# Patient Record
Sex: Male | Born: 1960 | Race: White | Hispanic: No | Marital: Married | Smoking: Never smoker
Health system: Southern US, Community
[De-identification: ages and names within clinical notes are randomized; demographics above are authoritative.]

## PROBLEM LIST (undated history)

## (undated) DIAGNOSIS — M109 Gout, unspecified: Secondary | ICD-10-CM

## (undated) DIAGNOSIS — I10 Essential (primary) hypertension: Secondary | ICD-10-CM

## (undated) DIAGNOSIS — Z86010 Personal history of colonic polyps: Secondary | ICD-10-CM

## (undated) HISTORY — DX: Personal history of colonic polyps: Z86.010

## (undated) HISTORY — DX: Essential (primary) hypertension: I10

## (undated) HISTORY — PX: WRIST SURGERY: SHX841

## (undated) HISTORY — DX: Gout, unspecified: M10.9

---

## 2005-10-20 ENCOUNTER — Ambulatory Visit: Payer: Self-pay | Admitting: Family Medicine

## 2005-10-29 ENCOUNTER — Ambulatory Visit: Payer: Self-pay | Admitting: Family Medicine

## 2006-07-31 ENCOUNTER — Ambulatory Visit: Payer: Self-pay | Admitting: Internal Medicine

## 2007-08-16 DIAGNOSIS — I10 Essential (primary) hypertension: Secondary | ICD-10-CM | POA: Insufficient documentation

## 2010-09-17 ENCOUNTER — Ambulatory Visit: Payer: Self-pay | Admitting: Family Medicine

## 2010-09-18 LAB — CONVERTED CEMR LAB
ALT: 29 units/L (ref 0–53)
AST: 30 units/L (ref 0–37)
Albumin: 4.3 g/dL (ref 3.5–5.2)
Alkaline Phosphatase: 56 units/L (ref 39–117)
BUN: 10 mg/dL (ref 6–23)
Basophils Absolute: 0.1 10*3/uL (ref 0.0–0.1)
Basophils Relative: 1.1 % (ref 0.0–3.0)
Bilirubin, Direct: 0.1 mg/dL (ref 0.0–0.3)
CO2: 22 meq/L (ref 19–32)
Calcium: 9.5 mg/dL (ref 8.4–10.5)
Chloride: 109 meq/L (ref 96–112)
Cholesterol: 152 mg/dL (ref 0–200)
Creatinine, Ser: 0.7 mg/dL (ref 0.4–1.5)
Eosinophils Absolute: 0.2 10*3/uL (ref 0.0–0.7)
Eosinophils Relative: 4 % (ref 0.0–5.0)
GFR calc non Af Amer: 127.49 mL/min (ref >60)
Glucose, Bld: 91 mg/dL (ref 70–99)
HCT: 40.8 % (ref 39.0–52.0)
HDL: 40.9 mg/dL (ref >39.00)
Hemoglobin: 14.3 g/dL (ref 13.0–17.0)
LDL Cholesterol: 99 mg/dL (ref 0–99)
Lymphocytes Relative: 34.1 % (ref 12.0–46.0)
Lymphs Abs: 1.7 10*3/uL (ref 0.7–4.0)
MCHC: 35.1 g/dL (ref 30.0–36.0)
MCV: 92.5 fL (ref 78.0–100.0)
Monocytes Absolute: 0.4 10*3/uL (ref 0.1–1.0)
Monocytes Relative: 7.8 % (ref 3.0–12.0)
Neutro Abs: 2.6 10*3/uL (ref 1.4–7.7)
Neutrophils Relative %: 53 % (ref 43.0–77.0)
Platelets: 193 10*3/uL (ref 150.0–400.0)
Potassium: 4.1 meq/L (ref 3.5–5.1)
RBC: 4.41 M/uL (ref 4.22–5.81)
RDW: 13 % (ref 11.5–14.6)
Sodium: 139 meq/L (ref 135–145)
TSH: 1.65 microintl units/mL (ref 0.35–5.50)
Testosterone: 376.54 ng/dL (ref 350.00–890.00)
Total Bilirubin: 0.7 mg/dL (ref 0.3–1.2)
Total CHOL/HDL Ratio: 4
Total Protein: 6.6 g/dL (ref 6.0–8.3)
Triglycerides: 62 mg/dL (ref 0.0–149.0)
VLDL: 12.4 mg/dL (ref 0.0–40.0)
WBC: 4.9 10*3/uL (ref 4.5–10.5)

## 2011-01-28 NOTE — Assessment & Plan Note (Signed)
Summary: CPX (PT WILL COME IN FASTING) // RS   Vital Signs:  Patient profile:   50 year old male Height:      72 inches Weight:      196 pounds BMI:     26.68 O2 Sat:      97 % on Room air Temp:     98.1 degrees F Pulse rate:   65 / minute BP sitting:   120 / 76  Vitals Entered By: Pura Spice, RN (September 17, 2010 11:03 AM)  O2 Flow:  Room air  Contraindications/Deferment of Procedures/Staging:    Test/Procedure: FLU VAX    Reason for deferment: patient declined  CC: cpx fasting for labs    History of Present Illness: 50 yr old male to re-establish with [place] and for a cpx. He feels good in general, although he would like to get his testosterone level checked. He feels a bit more fatigued in general recently but nothing specific. He does exercise regularly.   Allergies (verified): No Known Drug Allergies  Past History:  Past Medical History: Reviewed history from 08/16/2007 and no changes required. Hypertension  Past Surgical History: Reviewed history from 08/16/2007 and no changes required. Denies surgical history  Family History: Reviewed history from 08/16/2007 and no changes required. Family History Depression Family History Diabetes 1st degree relative Family History High cholesterol Family History Hypertension Family History of Stroke M 1st degree relative <50 Family History of Cardiovascular disorder  Social History: Reviewed history from 08/16/2007 and no changes required. 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    Physical Exam  General:  Well-developed,well-nourished,in no acute distress; alert,appropriate and cooperative throughout examination Head:  Normocephalic and atraumatic without obvious abnormalities. No apparent alopecia or balding. Eyes:  No corneal or conjunctival inflammation noted. EOMI. Perrla. Funduscopic exam benign, without hemorrhages, exudates or papilledema. Vision grossly normal. Ears:  External ear exam shows no significant lesions or deformities.  Otoscopic examination reveals clear canals, tympanic membranes are intact bilaterally without bulging, retraction, inflammation or discharge. Hearing is grossly normal bilaterally. Nose:  External nasal examination shows no deformity or inflammation. Nasal mucosa are pink and moist without lesions or exudates. Mouth:  Oral mucosa and oropharynx without lesions or exudates.  Teeth in good repair. Neck:  No deformities, masses, or tenderness noted. Chest Wall:  No deformities, masses, tenderness or gynecomastia noted. Lungs:  Normal respiratory effort, chest expands symmetrically. Lungs are clear to auscultation, no crackles or wheezes. Heart:  Normal rate and regular rhythm. S1 and S2 normal without gallop, murmur, click, rub or other extra sounds. Abdomen:  Bowel sounds positive,abdomen soft and non-tender without masses, organomegaly or hernias noted. Genitalia:  Testes bilaterally descended without nodularity, tenderness or masses. No scrotal masses or lesions. No penis lesions or urethral discharge. Msk:  No deformity or scoliosis noted of thoracic or lumbar spine.   Pulses:  R and L carotid,radial,femoral,dorsalis pedis and posterior tibial pulses are full and equal bilaterally Extremities:  No clubbing, cyanosis, edema, or deformity noted with normal full range of  motion of  all joints.   Neurologic:  No cranial nerve deficits noted. Station and gait are normal. Plantar reflexes are down-going bilaterally. DTRs are symmetrical throughout. Sensory, motor and coordinative functions appear intact. Skin:  Intact without suspicious lesions or rashes Cervical Nodes:  No lymphadenopathy noted Axillary Nodes:  No palpable lymphadenopathy Inguinal Nodes:  No significant adenopathy Psych:  Cognition and judgment appear intact. Alert and cooperative with normal attention span and concentration. No apparent delusions, illusions, hallucinations   Impression & Recommendations:  Problem # 1:  HEALTH MAINTENANCE EXAM (ICD-V70.0)  Orders: UA Dipstick w/o Micro (automated)  (81003) Venipuncture (18299) TLB-Lipid Panel (80061-LIPID) TLB-BMP (Basic Metabolic Panel-BMET) (80048-METABOL) TLB-CBC Platelet - w/Differential (85025-CBCD) TLB-Hepatic/Liver Function Pnl (80076-HEPATIC) TLB-TSH (Thyroid Stimulating Hormone) (84443-TSH) TLB-Testosterone, Total (84403-TESTO) Specimen Handling (37169)  Other Orders: Tdap => 19yrs IM (67893) Admin 1st Vaccine (81017)  Patient Instructions: 1)  get fasting labs    Immunizations Administered:  Tetanus Vaccine:    Vaccine Type: Tdap    Site: left deltoid    Mfr: GlaxoSmithKline    Dose: 0.5 ml    Route: IM    Given by: Pura Spice, RN    Exp. Date: 09/18/2012    Lot #: [id number]    VIS given: 11/15/08 version given September 17, 2010.   Appended Document: Orders Update    Clinical Lists Changes  Observations: Added new observation of COMMENTS: Wynona Canes, CMA  September 17, 2010 11:42 AM  (09/17/2010 11:42) Added new observation of PH URINE: 6.0  (09/17/2010 11:42) Added new observation of SPEC GR URIN: 1.010  (09/17/2010 11:42) Added new observation of APPEARANCE U: Clear  (09/17/2010 11:42) Added new observation of UA COLOR: yellow  (09/17/2010 11:42) Added new observation of WBC DIPSTK U:  negative  (09/17/2010 11:42) Added new observation of NITRITE URN: negative  (09/17/2010 11:42) Added new observation of UROBILINOGEN: 0.2  (09/17/2010 11:42) Added new observation of PROTEIN, URN: negative  (09/17/2010 11:42) Added new observation of BLOOD UR DIP: negative  (09/17/2010 11:42) Added new observation of KETONES URN: negative  (09/17/2010 11:42) Added new observation of BILIRUBIN UR: negative  (09/17/2010 11:42) Added new observation of GLUCOSE, URN: negative  (09/17/2010 11:42)      Laboratory Results   Urine Tests  Date/Time Recieved: September 17, 2010 11:42 AM  Date/Time Reported: September 17, 2010 11:42 AM   Routine Urinalysis   Color: yellow Appearance: Clear Glucose: negative   (Normal Range: Negative) Bilirubin: negative   (Normal Range: Negative) Ketone: negative   (Normal Range: Negative) Spec. Gravity: 1.010   (Normal Range: 1.003-1.035) Blood: negative   (Normal Range: Negative) pH: 6.0   (Normal Range: 5.0-8.0) Protein: negative   (Normal Range: Negative) Urobilinogen: 0.2   (Normal Range: 0-1) Nitrite: negative   (Normal Range: Negative) Leukocyte Esterace: negative   (Normal Range: Negative)    Comments: Wynona Canes, CMA  September 17, 2010 11:42 AM

## 2011-12-08 ENCOUNTER — Ambulatory Visit (INDEPENDENT_AMBULATORY_CARE_PROVIDER_SITE_OTHER): Payer: Commercial Managed Care - PPO

## 2011-12-08 DIAGNOSIS — J209 Acute bronchitis, unspecified: Secondary | ICD-10-CM

## 2011-12-08 DIAGNOSIS — J019 Acute sinusitis, unspecified: Secondary | ICD-10-CM

## 2011-12-08 DIAGNOSIS — J069 Acute upper respiratory infection, unspecified: Secondary | ICD-10-CM

## 2011-12-08 DIAGNOSIS — Z23 Encounter for immunization: Secondary | ICD-10-CM

## 2012-01-02 ENCOUNTER — Other Ambulatory Visit (INDEPENDENT_AMBULATORY_CARE_PROVIDER_SITE_OTHER): Payer: BC Managed Care – PPO

## 2012-01-02 DIAGNOSIS — Z0000 Encounter for general adult medical examination without abnormal findings: Secondary | ICD-10-CM

## 2012-01-02 LAB — POCT URINALYSIS DIPSTICK
Bilirubin, UA: NEGATIVE
Blood, UA: NEGATIVE
Glucose, UA: NEGATIVE
Ketones, UA: NEGATIVE
Leukocytes, UA: NEGATIVE
Nitrite, UA: NEGATIVE
Protein, UA: NEGATIVE
Spec Grav, UA: <=1.005
Urobilinogen, UA: 0.2
pH, UA: 7

## 2012-01-02 LAB — BASIC METABOLIC PANEL
BUN: 11 mg/dL (ref 6–23)
CO2: 28 mEq/L (ref 19–32)
Calcium: 9 mg/dL (ref 8.4–10.5)
Chloride: 103 mEq/L (ref 96–112)
Creatinine, Ser: 1 mg/dL (ref 0.4–1.5)
GFR: 89.15 mL/min (ref >60.00)
Glucose, Bld: 92 mg/dL (ref 70–99)
Potassium: 4.2 mEq/L (ref 3.5–5.1)
Sodium: 137 mEq/L (ref 135–145)

## 2012-01-02 LAB — LIPID PANEL
Cholesterol: 161 mg/dL (ref 0–200)
HDL: 38.8 mg/dL — ABNORMAL LOW (ref >39.00)
LDL Cholesterol: 110 mg/dL — ABNORMAL HIGH (ref 0–99)
Total CHOL/HDL Ratio: 4
Triglycerides: 63 mg/dL (ref 0.0–149.0)
VLDL: 12.6 mg/dL (ref 0.0–40.0)

## 2012-01-02 LAB — CBC WITH DIFFERENTIAL/PLATELET
Basophils Absolute: 0 10*3/uL (ref 0.0–0.1)
Basophils Relative: 0.7 % (ref 0.0–3.0)
Eosinophils Absolute: 0.2 10*3/uL (ref 0.0–0.7)
Eosinophils Relative: 3.4 % (ref 0.0–5.0)
HCT: 42.3 % (ref 39.0–52.0)
Hemoglobin: 14.4 g/dL (ref 13.0–17.0)
Lymphocytes Relative: 29.1 % (ref 12.0–46.0)
Lymphs Abs: 1.9 10*3/uL (ref 0.7–4.0)
MCHC: 34.1 g/dL (ref 30.0–36.0)
MCV: 92.5 fl (ref 78.0–100.0)
Monocytes Absolute: 0.4 10*3/uL (ref 0.1–1.0)
Monocytes Relative: 6.6 % (ref 3.0–12.0)
Neutro Abs: 3.8 10*3/uL (ref 1.4–7.7)
Neutrophils Relative %: 60.2 % (ref 43.0–77.0)
Platelets: 238 10*3/uL (ref 150.0–400.0)
RBC: 4.57 Mil/uL (ref 4.22–5.81)
RDW: 13.4 % (ref 11.5–14.6)
WBC: 6.4 10*3/uL (ref 4.5–10.5)

## 2012-01-02 LAB — HEPATIC FUNCTION PANEL
ALT: 38 U/L (ref 0–53)
AST: 34 U/L (ref 0–37)
Albumin: 4.1 g/dL (ref 3.5–5.2)
Alkaline Phosphatase: 67 U/L (ref 39–117)
Bilirubin, Direct: 0.1 mg/dL (ref 0.0–0.3)
Total Bilirubin: 0.8 mg/dL (ref 0.3–1.2)
Total Protein: 6.6 g/dL (ref 6.0–8.3)

## 2012-01-02 LAB — TSH: TSH: 1.63 u[IU]/mL (ref 0.35–5.50)

## 2012-01-02 LAB — PSA: PSA: 1.31 ng/mL (ref 0.10–4.00)

## 2012-01-07 ENCOUNTER — Encounter: Payer: Self-pay | Admitting: Family Medicine

## 2012-01-07 NOTE — Progress Notes (Signed)
Quick Note:  Spoke with pt and put a copy of results in mail. ______ 

## 2012-01-13 ENCOUNTER — Encounter: Payer: Self-pay | Admitting: Family Medicine

## 2012-01-13 ENCOUNTER — Ambulatory Visit (INDEPENDENT_AMBULATORY_CARE_PROVIDER_SITE_OTHER): Payer: BC Managed Care – PPO | Admitting: Family Medicine

## 2012-01-13 VITALS — BP 110/78 | HR 63 | Temp 98.2°F | Ht 72.0 in | Wt 202.0 lb

## 2012-01-13 DIAGNOSIS — Z0000 Encounter for general adult medical examination without abnormal findings: Secondary | ICD-10-CM

## 2012-01-13 DIAGNOSIS — Z136 Encounter for screening for cardiovascular disorders: Secondary | ICD-10-CM

## 2012-01-13 DIAGNOSIS — Z23 Encounter for immunization: Secondary | ICD-10-CM

## 2012-01-13 NOTE — Progress Notes (Signed)
  Subjective:    Patient ID: Tanner Hughes, male    DOB: [birth year], 51 y.o.   MRN: [id number]  HPI 51 yr old male for a cpx. He feels great and has no concerns.    Review of Systems  Constitutional: Negative.   HENT: Negative.   Eyes: Negative.   Respiratory: Negative.   Cardiovascular: Negative.   Gastrointestinal: Negative.   Genitourinary: Negative.   Musculoskeletal: Negative.   Skin: Negative.   Neurological: Negative.   Hematological: Negative.   Psychiatric/Behavioral: Negative.        Objective:   Physical Exam  Constitutional: He is oriented to person, place, and time. He appears well-developed and well-nourished. No distress.  HENT:  Head: Normocephalic and atraumatic.  Right Ear: External ear normal.  Left Ear: External ear normal.  Nose: Nose normal.  Mouth/Throat: Oropharynx is clear and moist. No oropharyngeal exudate.  Eyes: Conjunctivae and EOM are normal. Pupils are equal, round, and reactive to light. Right eye exhibits no discharge. Left eye exhibits no discharge. No scleral icterus.  Neck: Neck supple. No JVD present. No tracheal deviation present. No thyromegaly present.  Cardiovascular: Normal rate, regular rhythm, normal heart sounds and intact distal pulses.  Exam reveals no gallop and no friction rub.   No murmur heard.      EKG normal   Pulmonary/Chest: Effort normal and breath sounds normal. No respiratory distress. He has no wheezes. He has no rales. He exhibits no tenderness.  Abdominal: Soft. Bowel sounds are normal. He exhibits no distension and no mass. There is no tenderness. There is no rebound and no guarding.  Genitourinary: Rectum normal, prostate normal and penis normal. Guaiac negative stool. No penile tenderness.  Musculoskeletal: Normal range of motion. He exhibits no edema and no tenderness.  Lymphadenopathy:    He has no cervical adenopathy.  Neurological: He is alert and oriented to person, place, and time. He has normal reflexes.  No cranial nerve deficit. He exhibits normal muscle tone. Coordination normal.  Skin: Skin is warm and dry. No rash noted. He is not diaphoretic. No erythema. No pallor.  Psychiatric: He has a normal mood and affect. His behavior is normal. Judgment and thought content normal.          Assessment & Plan:  Well exam. Set up a cololoscopy

## 2012-01-26 ENCOUNTER — Encounter: Payer: Self-pay | Admitting: Family Medicine

## 2012-01-26 ENCOUNTER — Ambulatory Visit (INDEPENDENT_AMBULATORY_CARE_PROVIDER_SITE_OTHER): Payer: BC Managed Care – PPO | Admitting: Family Medicine

## 2012-01-26 VITALS — BP 110/68 | HR 85 | Temp 98.8°F | Wt 202.0 lb

## 2012-01-26 DIAGNOSIS — J40 Bronchitis, not specified as acute or chronic: Secondary | ICD-10-CM

## 2012-01-26 MED ORDER — AZITHROMYCIN 250 MG PO TABS: ORAL_TABLET | ORAL | Status: AC

## 2012-01-26 NOTE — Progress Notes (Signed)
  Subjective:    Patient ID: Tanner Hughes, male    DOB: 1961/11/13, 51 y.o.   MRN: [id number]  HPI Here for one week of chest tightness and a dry cough. No fever. On Robitussin.    Review of Systems  Constitutional: Negative.   HENT: Negative.   Eyes: Negative.   Respiratory: Positive for cough. Negative for shortness of breath and wheezing.        Objective:   Physical Exam  Constitutional: He appears well-developed and well-nourished.  HENT:  Right Ear: External ear normal.  Left Ear: External ear normal.  Nose: Nose normal.  Mouth/Throat: Oropharynx is clear and moist. No oropharyngeal exudate.  Eyes: Conjunctivae are normal.  Pulmonary/Chest: Effort normal. No respiratory distress. He has no wheezes. He has no rales.       Scattered rhonchi   Lymphadenopathy:    He has no cervical adenopathy.          Assessment & Plan:  Recheck prn

## 2012-01-30 ENCOUNTER — Telehealth: Payer: Self-pay | Admitting: Family Medicine

## 2012-01-30 NOTE — Telephone Encounter (Signed)
Spoke with pt

## 2012-01-30 NOTE — Telephone Encounter (Signed)
Too soon to take any more. The Zpack is designed to last for 10 days, so it will already be in his system for 5 more days

## 2012-01-30 NOTE — Telephone Encounter (Signed)
Refill Zpak to [facility]. Pt stated  that he is on day #5 of the 1st rx and is not completely better. Thanks.

## 2012-02-16 ENCOUNTER — Ambulatory Visit (AMBULATORY_SURGERY_CENTER): Payer: BC Managed Care – PPO | Admitting: *Deleted

## 2012-02-16 ENCOUNTER — Telehealth: Payer: Self-pay | Admitting: *Deleted

## 2012-02-16 ENCOUNTER — Encounter: Payer: Self-pay | Admitting: Gastroenterology

## 2012-02-16 VITALS — Ht 73.0 in | Wt 199.7 lb

## 2012-02-16 DIAGNOSIS — Z1211 Encounter for screening for malignant neoplasm of colon: Secondary | ICD-10-CM

## 2012-02-16 MED ORDER — PEG-KCL-NACL-NASULF-NA ASC-C 100 G PO SOLR: ORAL | Status: DC

## 2012-02-16 NOTE — Telephone Encounter (Signed)
Pt changed procedure date after leaving Previsit.  Called pt and reviewed updated prep instructions by phone. Tanner Hughes

## 2012-02-27 ENCOUNTER — Other Ambulatory Visit: Payer: BC Managed Care – PPO | Admitting: Gastroenterology

## 2012-03-15 ENCOUNTER — Ambulatory Visit (AMBULATORY_SURGERY_CENTER): Payer: BC Managed Care – PPO | Admitting: Internal Medicine

## 2012-03-15 ENCOUNTER — Encounter: Payer: Self-pay | Admitting: Internal Medicine

## 2012-03-15 VITALS — BP 137/75 | HR 56 | Temp 96.8°F | Resp 18 | Ht 73.0 in | Wt 199.0 lb

## 2012-03-15 DIAGNOSIS — K635 Polyp of colon: Secondary | ICD-10-CM

## 2012-03-15 DIAGNOSIS — Z1211 Encounter for screening for malignant neoplasm of colon: Secondary | ICD-10-CM

## 2012-03-15 DIAGNOSIS — D126 Benign neoplasm of colon, unspecified: Secondary | ICD-10-CM

## 2012-03-15 MED ORDER — SODIUM CHLORIDE 0.9 % IV SOLN: 500.0000 mL | INTRAVENOUS | Status: DC

## 2012-03-15 NOTE — Progress Notes (Signed)
Patient did not have preoperative order for IV antibiotic SSI prophylaxis. (G8918)  Patient did not experience any of the following events: a burn prior to discharge; a fall within the facility; wrong site/side/patient/procedure/implant event; or a hospital transfer or hospital admission upon discharge from the facility. (G8907)  

## 2012-03-15 NOTE — Patient Instructions (Addendum)
YOU HAD AN ENDOSCOPIC PROCEDURE TODAY AT THE [facility]: Refer to the procedure report that was given to you for any specific questions about what was found during the examination.  If the procedure report does not answer your questions, please call your gastroenterologist to clarify.  If you requested that your care partner not be given the details of your procedure findings, then the procedure report has been included in a sealed envelope for you to review at your convenience later.  YOU SHOULD EXPECT: Some feelings of bloating in the abdomen. Passage of more gas than usual.  Walking can help get rid of the air that was put into your GI tract during the procedure and reduce the bloating. If you had a lower endoscopy (such as a colonoscopy or flexible sigmoidoscopy) you may notice spotting of blood in your stool or on the toilet paper. If you underwent a bowel prep for your procedure, then you may not have a normal bowel movement for a few days.  DIET: Your first meal following the procedure should be a light meal and then it is ok to progress to your normal diet.  A half-sandwich or bowl of soup is an example of a good first meal.  Heavy or fried foods are harder to digest and may make you feel nauseous or bloated.  Likewise meals heavy in dairy and vegetables can cause extra gas to form and this can also increase the bloating.  Drink plenty of fluids but you should avoid alcoholic beverages for 24 hours.  ACTIVITY: Your care partner should take you home directly after the procedure.  You should plan to take it easy, moving slowly for the rest of the day.  You can resume normal activity the day after the procedure however you should NOT DRIVE or use heavy machinery for 24 hours (because of the sedation medicines used during the test).    SYMPTOMS TO REPORT IMMEDIATELY: A gastroenterologist can be reached at any hour.  During normal business hours, 8:30 AM to 5:00 PM Monday through Friday,  call [phone].  After hours and on weekends, please call the GI answering service at [phone] who will take a message and have the physician on call contact you.   Following lower endoscopy (colonoscopy or flexible sigmoidoscopy):  Excessive amounts of blood in the stool  Significant tenderness or worsening of abdominal pains  Swelling of the abdomen that is new, acute  Fever of 100F or higher    FOLLOW UP: If any biopsies were taken you will be contacted by phone or by letter within the next 1-3 weeks.  Call your gastroenterologist if you have not heard about the biopsies in 3 weeks.  Our staff will call the home number listed on your records the next business day following your procedure to check on you and address any questions or concerns that you may have at that time regarding the information given to you following your procedure. This is a courtesy call and so if there is no answer at the home number and we have not heard from you through the emergency physician on call, we will assume that you have returned to your regular daily activities without incident.  SIGNATURES/CONFIDENTIALITY: You and/or your care partner have signed paperwork which will be entered into your electronic medical record.  These signatures attest to the fact that that the information above on your After Visit Summary has been reviewed and is understood.  Full responsibility of the confidentiality   of this discharge information lies with you and/or your care-partner.     

## 2012-03-15 NOTE — Op Note (Signed)
[facility] [address] [place], [place]  [address]  COLONOSCOPY PROCEDURE REPORT  PATIENT:  Damare, Serano  MR#:  [id number] BIRTHDATE:  08/11/1961, 50 yrs. old  GENDER:  male ENDOSCOPIST:  Iva Boop, MD, [facility] REF. BY:  Tera Mater. Clent Ridges, M.D. PROCEDURE DATE:  03/15/2012 PROCEDURE:  Colonoscopy with snare polypectomy ASA CLASS:  Class II INDICATIONS:  Routine Risk Screening MEDICATIONS:   These medications were titrated to patient response per physician's verbal order, Fentanyl 75 mcg IV, Versed 7 mg IV  DESCRIPTION OF PROCEDURE:   After the risks benefits and alternatives of the procedure were thoroughly explained, informed consent was obtained.  Digital rectal exam was performed and revealed no abnormalities and normal prostate.   The LB 180AL [id number] endoscope was introduced through the anus and advanced to the cecum, which was identified by both the appendix and ileocecal valve, without limitations.  The quality of the prep was excellent, using MoviPrep.  The instrument was then slowly withdrawn as the colon was fully examined. <<PROCEDUREIMAGES>>  FINDINGS:  Two polyps were found in the cecum. Two diminutive polyps - adjacent - removed via cold snare together.  This was otherwise a normal examination of the colon. Includes right colon retroflexion.   Retroflexed views in the rectum revealed no abnormalities.    The time to cecum = 6:21 minutes. The scope was then withdrawn in 13:26 minutes from the cecum and the procedure completed. COMPLICATIONS:  None ENDOSCOPIC IMPRESSION: 1) Two diminutive polyps removed from the cecum 2) Otherwise normal examination - excellent prep  REPEAT EXAM:  In for Colonoscopy, pending biopsy results.  Iva Boop, MD, [facility]  CC:  Nelwyn Salisbury, MD and The Patient  n. eSIGNED:   Iva Boop at 03/15/2012 03:42 PM  Maryclare Labrador, [phone]

## 2012-03-16 ENCOUNTER — Telehealth: Payer: Self-pay

## 2012-03-16 NOTE — Telephone Encounter (Signed)
  Follow up Call-  Call back number 03/15/2012  Post procedure Call Back phone  # [phone]  Permission to leave phone message Yes     Patient questions:  Do you have a fever, pain , or abdominal swelling? no Pain Score  0 *  Have you tolerated food without any problems? yes  Have you been able to return to your normal activities? yes  Do you have any questions about your discharge instructions: Diet   no Medications  no Follow up visit  no  Do you have questions or concerns about your Care? no  Actions: * If pain score is 4 or above: No action needed, pain <4.

## 2012-03-22 ENCOUNTER — Encounter: Payer: Self-pay | Admitting: Internal Medicine

## 2012-03-22 DIAGNOSIS — Z860100 Personal history of colon polyps, unspecified: Secondary | ICD-10-CM | POA: Insufficient documentation

## 2012-03-22 HISTORY — DX: Personal history of colon polyps, unspecified: Z86.0100

## 2012-03-22 HISTORY — DX: Personal history of colonic polyps: Z86.010

## 2012-03-22 NOTE — Progress Notes (Signed)
Quick Note:  1 diminutive adenoma Repeat colonoscopy about 5 yrs = 02/2017 ______

## 2012-05-07 ENCOUNTER — Encounter: Payer: Self-pay | Admitting: Family Medicine

## 2012-05-07 ENCOUNTER — Ambulatory Visit (INDEPENDENT_AMBULATORY_CARE_PROVIDER_SITE_OTHER): Payer: BC Managed Care – PPO | Admitting: Family Medicine

## 2012-05-07 VITALS — BP 118/76 | HR 79 | Temp 97.8°F | Wt 201.0 lb

## 2012-05-07 DIAGNOSIS — S239XXA Sprain of unspecified parts of thorax, initial encounter: Secondary | ICD-10-CM

## 2012-05-07 DIAGNOSIS — IMO0002 Reserved for concepts with insufficient information to code with codable children: Secondary | ICD-10-CM

## 2012-05-07 MED ORDER — DICLOFENAC SODIUM 75 MG PO TBEC: 75.0000 mg | DELAYED_RELEASE_TABLET | Freq: Two times a day (BID) | ORAL | Status: DC

## 2012-05-07 MED ORDER — CYCLOBENZAPRINE HCL 10 MG PO TABS: 10.0000 mg | ORAL_TABLET | Freq: Three times a day (TID) | ORAL | Status: DC | PRN

## 2012-05-07 NOTE — Progress Notes (Signed)
  Subjective:    Patient ID: Tanner Hughes, male    DOB: [birth year], 51 y.o.   MRN: [id number]  HPI Here for 2 weeks of tightness and mild pain in the middle of his back. No specific hx of trauma but he is very active with running, biking, lifting weigths, etc. Using heat and Advil.    Review of Systems  Constitutional: Negative.   Musculoskeletal: Positive for back pain.       Objective:   Physical Exam  Constitutional: He appears well-developed and well-nourished. No distress.  Musculoskeletal:       Mildly tender with some spasm along both sides of the thoracic spine. Full ROM          Assessment & Plan:  Rest, heat. Use Diclofenac and Flexeril prn

## 2012-05-28 ENCOUNTER — Ambulatory Visit (INDEPENDENT_AMBULATORY_CARE_PROVIDER_SITE_OTHER): Payer: BC Managed Care – PPO | Admitting: Family Medicine

## 2012-05-28 ENCOUNTER — Encounter: Payer: Self-pay | Admitting: Family Medicine

## 2012-05-28 ENCOUNTER — Ambulatory Visit: Payer: BC Managed Care – PPO | Admitting: Family Medicine

## 2012-05-28 VITALS — BP 118/80 | HR 106 | Temp 98.0°F | Wt 198.0 lb

## 2012-05-28 DIAGNOSIS — M546 Pain in thoracic spine: Secondary | ICD-10-CM

## 2012-05-28 NOTE — Progress Notes (Signed)
  Subjective:    Patient ID: Tanner Hughes, male    DOB: 1961/07/29, 51 y.o.   MRN: [id number]  HPI Here for continued stiffness and discomfort in the middle back. He does feel much better than before, and he is using one Diclofenac a day. He has not been able to exercise regularly however.    Review of Systems  Constitutional: Negative.   Musculoskeletal: Positive for back pain.       Objective:   Physical Exam  Constitutional: He appears well-developed and well-nourished.  Musculoskeletal: Normal range of motion. He exhibits no edema and no tenderness.          Assessment & Plan:  Residual back discomfort. We will refer to PT for 3-4 weeks

## 2012-05-31 ENCOUNTER — Ambulatory Visit: Payer: BC Managed Care – PPO | Admitting: Family Medicine

## 2012-06-07 ENCOUNTER — Ambulatory Visit: Payer: BC Managed Care – PPO | Attending: Family Medicine

## 2012-06-07 DIAGNOSIS — M545 Low back pain, unspecified: Secondary | ICD-10-CM | POA: Insufficient documentation

## 2012-06-07 DIAGNOSIS — R5381 Other malaise: Secondary | ICD-10-CM | POA: Insufficient documentation

## 2012-06-07 DIAGNOSIS — IMO0001 Reserved for inherently not codable concepts without codable children: Secondary | ICD-10-CM | POA: Insufficient documentation

## 2012-06-07 DIAGNOSIS — M25559 Pain in unspecified hip: Secondary | ICD-10-CM | POA: Insufficient documentation

## 2012-06-07 DIAGNOSIS — M546 Pain in thoracic spine: Secondary | ICD-10-CM | POA: Insufficient documentation

## 2012-06-14 ENCOUNTER — Ambulatory Visit: Payer: BC Managed Care – PPO

## 2012-06-21 ENCOUNTER — Ambulatory Visit: Payer: BC Managed Care – PPO

## 2012-07-08 ENCOUNTER — Telehealth: Payer: Self-pay | Admitting: Family Medicine

## 2012-07-08 NOTE — Telephone Encounter (Signed)
Caller: Tanner Hughes/Patient; PCP: Nelwyn Salisbury.; CB#: [phone]; ; ; Call regarding Abdominal Pain; Onset est 1-2 years ago; it is intermittent.  Pain lasts approx 1 minute and is severe; left of umbilicus.  Rates pain at 9 of 10 for 1-2 min when it happens.  Relates the episodes are increasing in frequency and may be triggered with certain position of leaning foreward.  Advised see provider in 72 hours per Abdominal Pain protocol.  Appt w/ Dr. Clent Ridges on 07/09/12 at 1430. Home care for the  interim and parameters for callback given.

## 2012-07-09 ENCOUNTER — Ambulatory Visit (INDEPENDENT_AMBULATORY_CARE_PROVIDER_SITE_OTHER): Payer: BC Managed Care – PPO | Admitting: Family Medicine

## 2012-07-09 ENCOUNTER — Encounter: Payer: Self-pay | Admitting: Family Medicine

## 2012-07-09 VITALS — BP 118/82 | HR 82 | Temp 98.6°F | Wt 202.0 lb

## 2012-07-09 DIAGNOSIS — K429 Umbilical hernia without obstruction or gangrene: Secondary | ICD-10-CM

## 2012-07-09 NOTE — Progress Notes (Signed)
  Subjective:    Patient ID: Tanner Hughes, male    DOB: [birth year], 51 y.o.   MRN: [id number]  HPI Here for intermittent pains around the navel that started about 2 years ago. These pains may occur once or twice a month. They are sharp and can be fairly severe. They are brief and only last a minute or two. No fever or nausea. No changes in BMs or urinations.    Review of Systems  Constitutional: Negative.   Respiratory: Negative.   Cardiovascular: Negative.   Gastrointestinal: Positive for abdominal pain. Negative for nausea, vomiting, diarrhea, constipation, blood in stool, abdominal distention and rectal pain.  Genitourinary: Negative.        Objective:   Physical Exam  Constitutional: He appears well-developed and well-nourished.  Abdominal: Soft. Bowel sounds are normal. He exhibits no distension and no mass. There is no rebound and no guarding.       Mildly tender around the umbilicus, no hernias are felt           Assessment & Plan:  Probable early umbilical hernia. Refer to Surgery

## 2012-08-05 ENCOUNTER — Other Ambulatory Visit (INDEPENDENT_AMBULATORY_CARE_PROVIDER_SITE_OTHER): Payer: Self-pay | Admitting: Surgery

## 2012-08-05 ENCOUNTER — Ambulatory Visit (INDEPENDENT_AMBULATORY_CARE_PROVIDER_SITE_OTHER): Payer: BC Managed Care – PPO | Admitting: General Surgery

## 2012-08-05 ENCOUNTER — Ambulatory Visit (INDEPENDENT_AMBULATORY_CARE_PROVIDER_SITE_OTHER): Payer: BC Managed Care – PPO | Admitting: Surgery

## 2012-08-05 ENCOUNTER — Encounter (INDEPENDENT_AMBULATORY_CARE_PROVIDER_SITE_OTHER): Payer: Self-pay | Admitting: Surgery

## 2012-08-05 VITALS — BP 136/92 | HR 71 | Temp 97.6°F | Ht 73.0 in | Wt 203.0 lb

## 2012-08-05 DIAGNOSIS — K469 Unspecified abdominal hernia without obstruction or gangrene: Secondary | ICD-10-CM

## 2012-08-05 DIAGNOSIS — R109 Unspecified abdominal pain: Secondary | ICD-10-CM | POA: Insufficient documentation

## 2012-08-05 NOTE — Progress Notes (Signed)
Patient ID: [name], male   DOB: 12-01-1961, 51 y.o.   MRN: [id number]  Chief Complaint  Patient presents with  . Pre-op Exam    eval umb hernia    HPI Tanner Hughes is a 51 y.o. male.  He is referred by Dr. Clent Ridges for evaluation of periumbilical abdominal pain. He has never noticed a bulge but has sharp spasmodic pain which is going on for several years just above and to the left of the umbilicus. He denies any trauma. The pain can be severe. He has no nausea or vomiting or tract of symptoms. He has recently had a colonoscopy which was normal. He has no fevers or chills and has had no significant weight loss HPI  Past Medical History  Diagnosis Date  . Hypertension     Past Surgical History  Procedure Date  . No past surgeries     Family History  Problem Relation Age of Onset  . Depression    . Diabetes    . Hyperlipidemia    . Hypertension    . Stroke    . Heart disease    . Colon cancer Neg Hx   . Stomach cancer Neg Hx     Social History History  Substance Use Topics  . Smoking status: Never Smoker   . Smokeless tobacco: Never Used  . Alcohol Use: 3.5 oz/week    7 drink(s) per week     1/daily    No Known Allergies  No current outpatient prescriptions on file.    Review of Systems Review of Systems  Constitutional: Negative for fever, chills and unexpected weight change.  HENT: Negative for hearing loss, congestion, sore throat, trouble swallowing and voice change.   Eyes: Negative for visual disturbance.  Respiratory: Negative for cough and wheezing.   Cardiovascular: Negative for chest pain, palpitations and leg swelling.  Gastrointestinal: Positive for abdominal pain. Negative for nausea, vomiting, diarrhea, constipation, blood in stool, abdominal distention, anal bleeding and rectal pain.  Genitourinary: Negative for hematuria and difficulty urinating.  Musculoskeletal: Negative for arthralgias.  Skin: Negative for rash and wound.  Neurological:  Negative for seizures, syncope, weakness and headaches.  Hematological: Negative for adenopathy. Does not bruise/bleed easily.  Psychiatric/Behavioral: Negative for confusion.    Blood pressure 136/92, pulse 71, temperature 97.6 F (36.4 C), temperature source Temporal, height 6\' 1"  (1.854 m), weight 203 lb (92.08 kg), SpO2 98.00%.  Physical Exam Physical Exam  Constitutional: He is oriented to person, place, and time. He appears well-developed and well-nourished. No distress.  HENT:  Head: Normocephalic and atraumatic.  Right Ear: External ear normal.  Left Ear: External ear normal.  Nose: Nose normal.  Mouth/Throat: Oropharynx is clear and moist.  Eyes: Conjunctivae are normal. Pupils are equal, round, and reactive to light. Right eye exhibits no discharge. Left eye exhibits no discharge. No scleral icterus.  Cardiovascular: Normal rate, regular rhythm, normal heart sounds and intact distal pulses.   No murmur heard. Pulmonary/Chest: Effort normal. No respiratory distress. He has no wheezes. He has no rales.  Abdominal: Soft. Bowel sounds are normal. He exhibits no distension. There is no tenderness. There is no rebound.       I cannot palpate an umbilical hernia. There is some fullness and slight tenderness along the left rectus muscle  Neurological: He is alert and oriented to person, place, and time.  Skin: Skin is warm and dry. He is not diaphoretic.  Psychiatric: His behavior is normal. Judgment normal.  Data Reviewed   Assessment    Abdominal pain of uncertain etiology    Plan    This may represent some kind of mass and the rectus muscle like a lipoma or desmoid tumor. Again, I cannot feel a hernia. I want to get a CAT scan of his abdomen and pelvis to better evaluate this area to determine whether surgery is necessary. I will call him with the results of his CAT scan.       Elmon Shader A 08/05/2012, 3:26 PM

## 2012-08-06 ENCOUNTER — Ambulatory Visit
Admission: RE | Admit: 2012-08-06 | Discharge: 2012-08-06 | Disposition: A | Discharge status: Home / Self Care | Payer: BC Managed Care – PPO | Source: Ambulatory Visit | Attending: Surgery | Admitting: Surgery

## 2012-08-06 DIAGNOSIS — K469 Unspecified abdominal hernia without obstruction or gangrene: Secondary | ICD-10-CM

## 2012-08-06 MED ORDER — IOHEXOL 300 MG/ML  SOLN
125.0000 mL | Freq: Once | INTRAMUSCULAR | Status: AC | PRN
  Administered 2012-08-06: 125 mL via INTRAVENOUS

## 2012-08-09 ENCOUNTER — Other Ambulatory Visit (INDEPENDENT_AMBULATORY_CARE_PROVIDER_SITE_OTHER): Payer: Self-pay | Admitting: Surgery

## 2012-08-09 ENCOUNTER — Telehealth (INDEPENDENT_AMBULATORY_CARE_PROVIDER_SITE_OTHER): Payer: Self-pay

## 2012-08-09 ENCOUNTER — Encounter (INDEPENDENT_AMBULATORY_CARE_PROVIDER_SITE_OTHER): Payer: Self-pay | Admitting: Surgery

## 2012-08-09 DIAGNOSIS — M898X8 Other specified disorders of bone, other site: Secondary | ICD-10-CM

## 2012-08-09 NOTE — Telephone Encounter (Signed)
Pt's wife calling about appointment and rescheduling.  Pls call her at [phone].

## 2012-08-16 ENCOUNTER — Other Ambulatory Visit: Payer: BC Managed Care – PPO

## 2012-08-23 ENCOUNTER — Encounter (INDEPENDENT_AMBULATORY_CARE_PROVIDER_SITE_OTHER): Payer: Self-pay | Admitting: Surgery

## 2012-08-23 ENCOUNTER — Ambulatory Visit (INDEPENDENT_AMBULATORY_CARE_PROVIDER_SITE_OTHER): Payer: BC Managed Care – PPO | Admitting: Surgery

## 2012-08-23 VITALS — BP 123/81 | HR 82 | Temp 96.9°F | Resp 18 | Ht 73.0 in | Wt 199.0 lb

## 2012-08-23 DIAGNOSIS — R1033 Periumbilical pain: Secondary | ICD-10-CM

## 2012-08-23 DIAGNOSIS — R109 Unspecified abdominal pain: Secondary | ICD-10-CM

## 2012-08-23 NOTE — Progress Notes (Signed)
Subjective:     Patient ID: Tanner Hughes, male   DOB: [birth year], 51 y.o.   MRN: [id number]  HPI He is here for a followup visit. 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 There following this electively. He still has occasional pain at the umbilicus.  Review of Systems     Objective:   Physical Exam On exam, I cannot feel a real defect but there is tenderness at the umbilicus and just to the left of the umbilicus. A CT scan was equivocal regarding a hernia. There were no Doppler wall mass either.    Assessment:     Possible umbilical hernia    Plan:     I gave him the option of continued expectant management versus laparoscopy versus exploratory the umbilicus. He is going to call me back when he decides on his next course of action. He is leaning toward expiration of the umbilicus

## 2013-01-07 ENCOUNTER — Telehealth (INDEPENDENT_AMBULATORY_CARE_PROVIDER_SITE_OTHER): Payer: Self-pay | Admitting: General Surgery

## 2013-01-07 NOTE — Telephone Encounter (Signed)
Patient called in stating that he did not remember the name of the doctor he was to be referred to during his last visit with Dr. Magnus Ivan in August. I checked the notes and did not see a name. The patient did confirm he was seen for bone pain at that time. Patient requested call back on his cell and if possible a text can be sent to advise. Contact numbers were confirmed and the patient stated a message can be left to advise.

## 2013-01-07 NOTE — Telephone Encounter (Signed)
There should be notes some where.  He was sent to [facility] to see the male orthopedic oncologist there.

## 2013-04-29 ENCOUNTER — Ambulatory Visit (INDEPENDENT_AMBULATORY_CARE_PROVIDER_SITE_OTHER): Payer: BC Managed Care – PPO | Admitting: Family Medicine

## 2013-04-29 ENCOUNTER — Encounter: Payer: Self-pay | Admitting: Family Medicine

## 2013-04-29 ENCOUNTER — Ambulatory Visit (INDEPENDENT_AMBULATORY_CARE_PROVIDER_SITE_OTHER)
Admission: RE | Admit: 2013-04-29 | Discharge: 2013-04-29 | Disposition: A | Discharge status: Home / Self Care | Payer: BC Managed Care – PPO | Source: Ambulatory Visit | Attending: Family Medicine | Admitting: Family Medicine

## 2013-04-29 VITALS — BP 114/80 | HR 61 | Wt 206.0 lb

## 2013-04-29 DIAGNOSIS — M545 Low back pain, unspecified: Secondary | ICD-10-CM

## 2013-04-29 DIAGNOSIS — M546 Pain in thoracic spine: Secondary | ICD-10-CM

## 2013-04-29 MED ORDER — CYCLOBENZAPRINE HCL 10 MG PO TABS: 10.0000 mg | ORAL_TABLET | Freq: Three times a day (TID) | ORAL | Status: AC | PRN

## 2013-04-29 NOTE — Progress Notes (Signed)
  Subjective:    Patient ID: Tanner Hughes, male    DOB: [birth year], 51 y.o.   MRN: [id number]  HPI Here for recurrent stiffness and pain in the middle and lower back. Both sides are involved but the right side is worst. Sometimes the pain radiates down the right leg. He went to PT last year for this, and this was successful. His back did not bother him for several months, but now it has returned. He does not take any meds for it. Stretches help. He still runs and lifts weights.    Review of Systems  Constitutional: Negative.   Musculoskeletal: Positive for back pain.       Objective:   Physical Exam  Constitutional: He appears well-developed and well-nourished. No distress.  Musculoskeletal:  He has some mild lateral scoliosis in the lower thoracic and upper lumbar spine. No tenderness but there is a lot of spasm of the right paraspinal muscles. Full ROM           Assessment & Plan:  Chronic back spasms mostly due to scoliosis. Get Xrays of the spine. Use Flexeril and Advil prn. Advised him to try yoga as a means of improving flexibility in the spine.

## 2013-05-02 NOTE — Progress Notes (Signed)
Quick Note:  I spoke with pt ______ 

## 2014-01-24 IMAGING — CR DG LUMBAR SPINE COMPLETE 4+V
5 series · 5 of 5 positions shown · non-contrast
Comparison: 08/06/2012 CT.

CLINICAL DATA: Low back pain.  No known injury.

LUMBAR SPINE - COMPLETE 4+ VIEW

[view not recorded (1 of 5)]
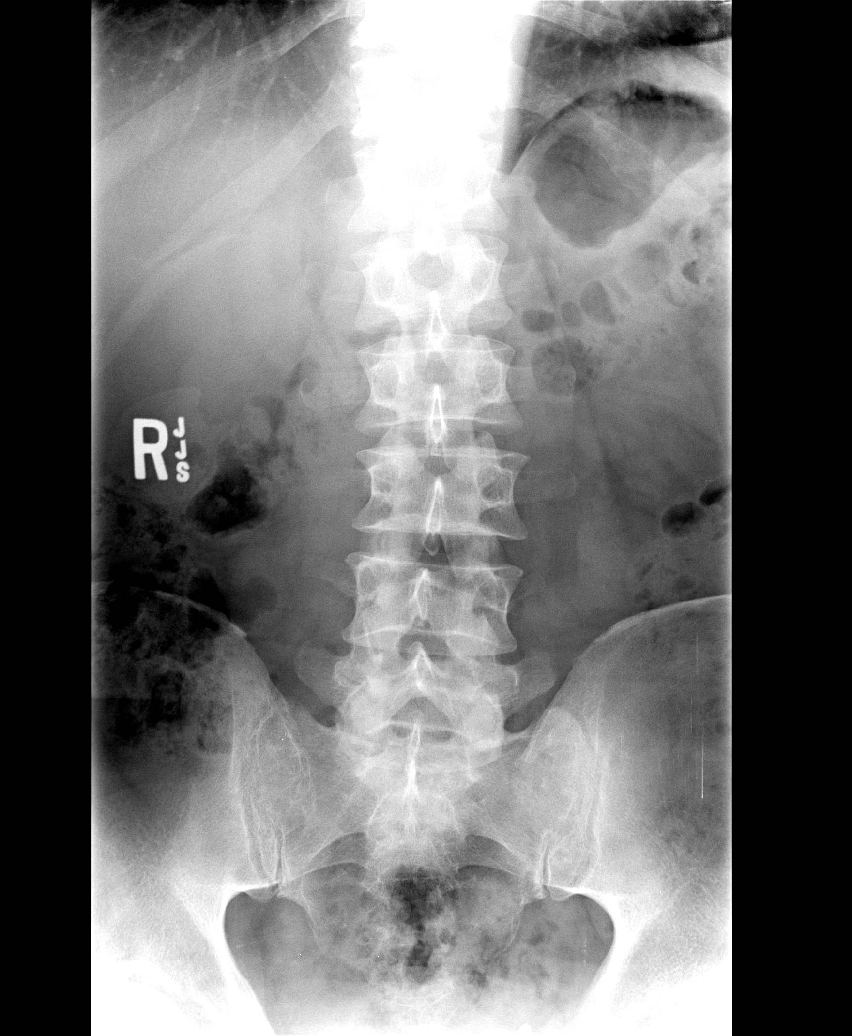

[view not recorded (2 of 5)]
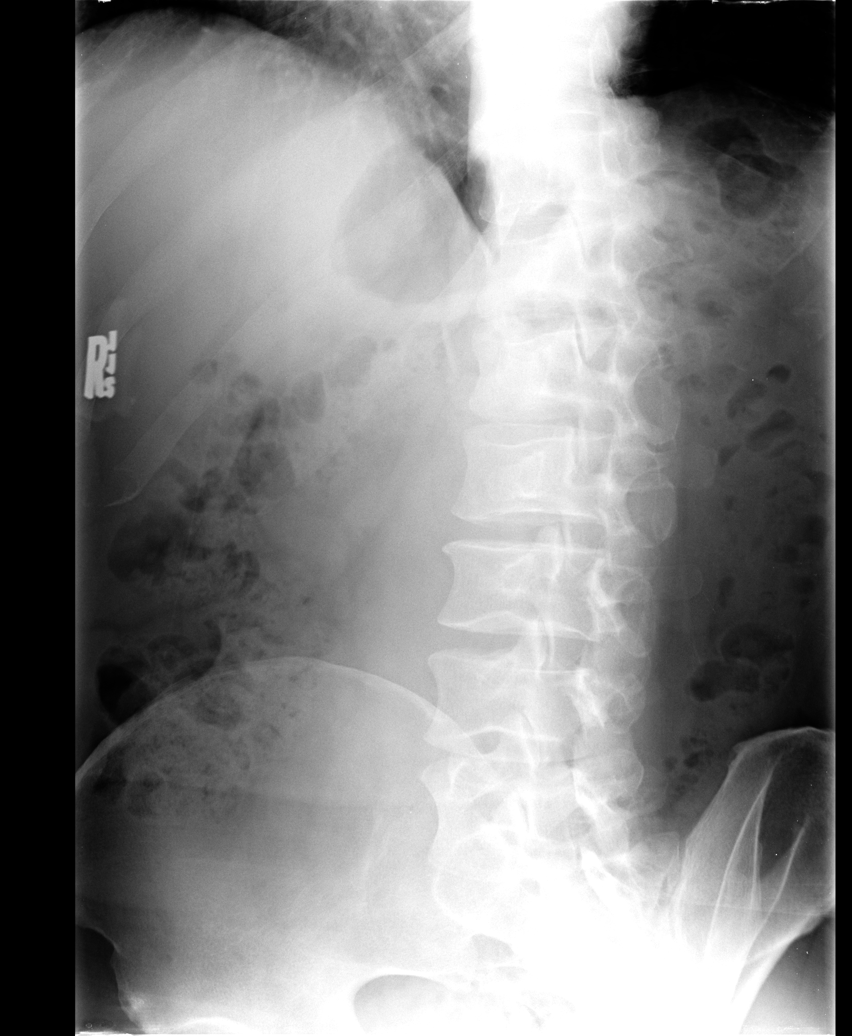

[view not recorded (3 of 5)]
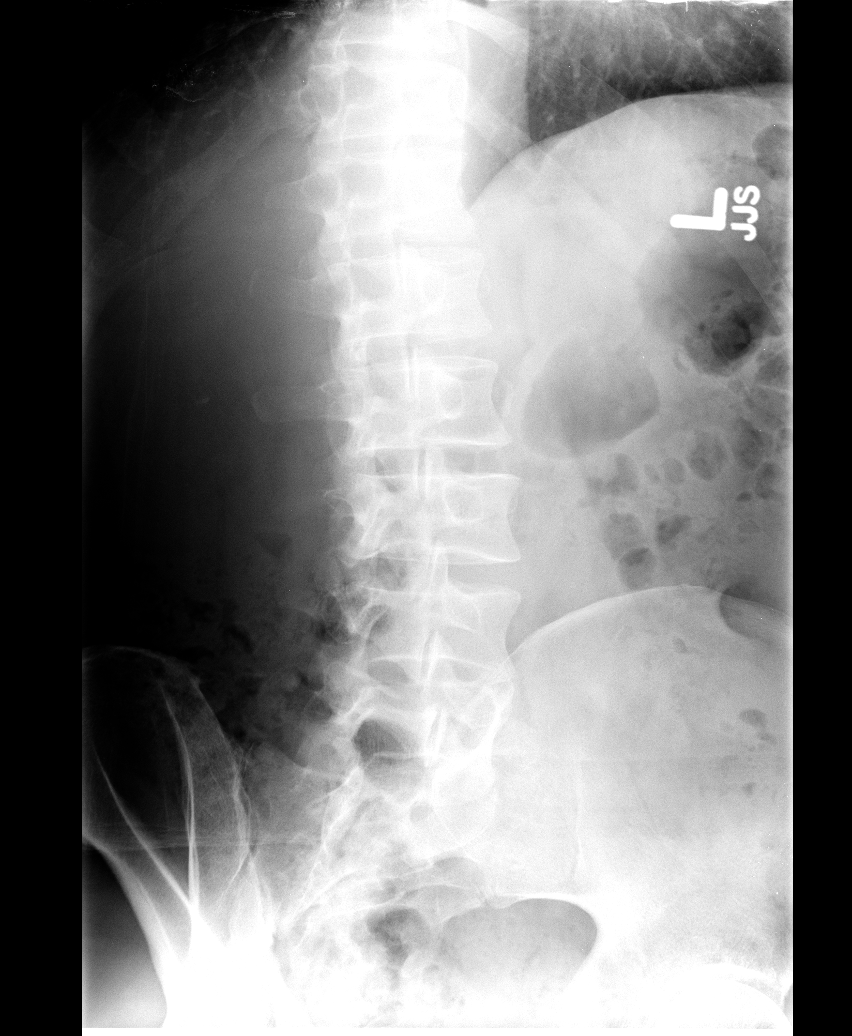

[view not recorded (4 of 5)]
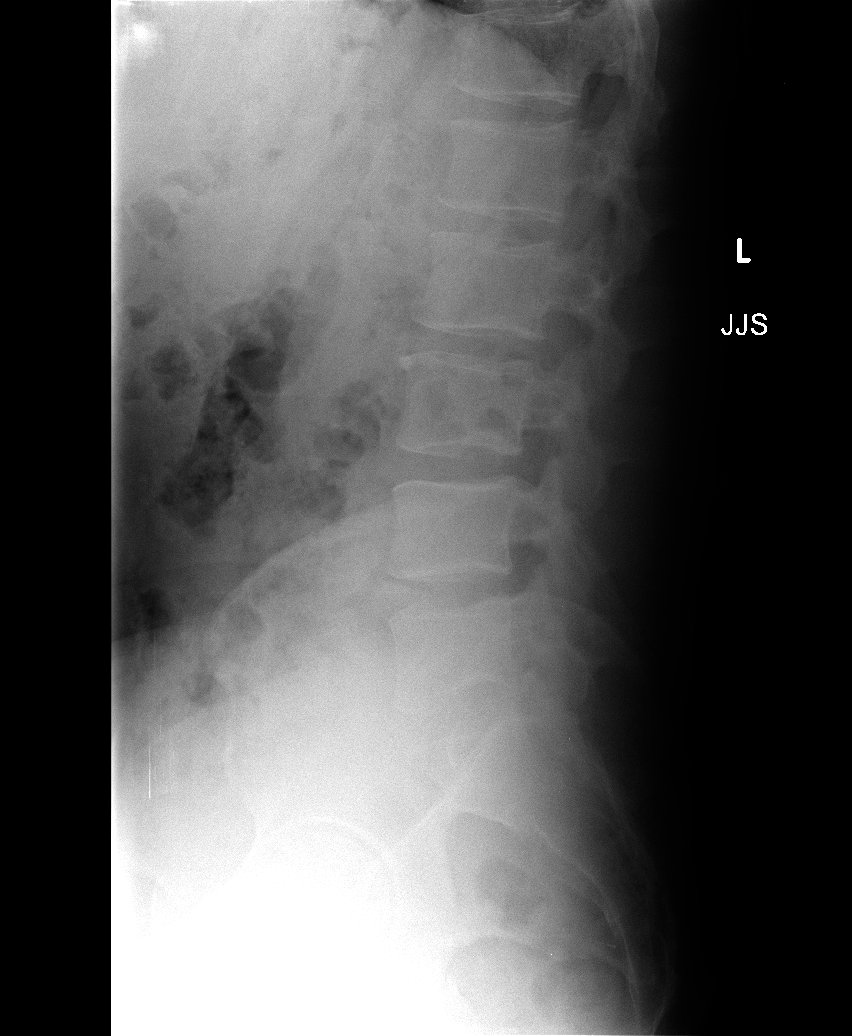

[view not recorded (5 of 5)]
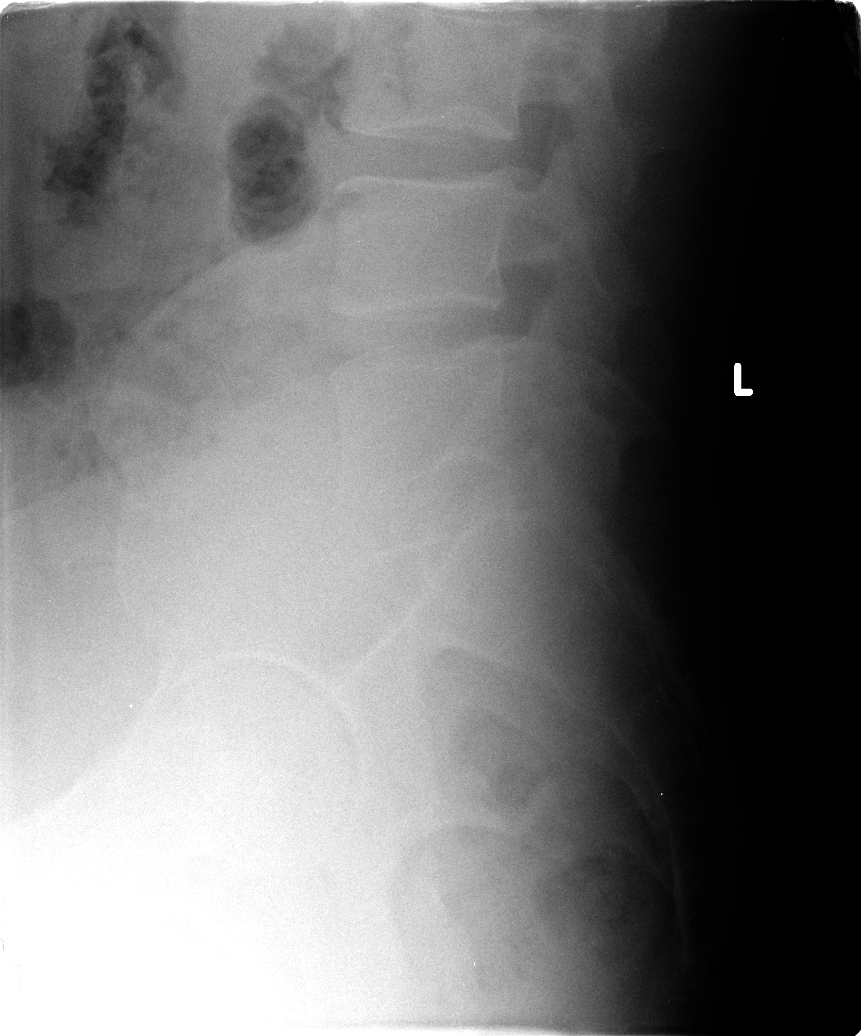

[5 of 5 positions shown; findings below may reference images not displayed]

FINDINGS: Slight levoscoliosis.  No fracture or subluxation disc
spaces are maintained.  SI joints are symmetric.  Lucent area
within the posterior right iliac bone as seen on prior CT is again
noted, likely not significantly changed.
IMPRESSION: No acute bony abnormality.

Lucent area in the posterior medial right iliac bone at the right
SI joint again noted as seen on prior CT.    Likely no significant
change since prior CT.

## 2014-05-16 ENCOUNTER — Ambulatory Visit (INDEPENDENT_AMBULATORY_CARE_PROVIDER_SITE_OTHER): Payer: BC Managed Care – PPO | Admitting: Family Medicine

## 2014-05-16 ENCOUNTER — Encounter: Payer: Self-pay | Admitting: Family Medicine

## 2014-05-16 VITALS — BP 128/86 | HR 72 | Temp 99.6°F | Ht 73.0 in | Wt 212.0 lb

## 2014-05-16 DIAGNOSIS — Z7189 Other specified counseling: Secondary | ICD-10-CM

## 2014-05-16 DIAGNOSIS — Z23 Encounter for immunization: Secondary | ICD-10-CM

## 2014-05-16 DIAGNOSIS — IMO0002 Reserved for concepts with insufficient information to code with codable children: Secondary | ICD-10-CM

## 2014-05-16 MED ORDER — MEFLOQUINE HCL 250 MG PO TABS: 250.0000 mg | ORAL_TABLET | ORAL | Status: DC

## 2014-05-16 MED ORDER — CIPROFLOXACIN HCL 500 MG PO TABS: 500.0000 mg | ORAL_TABLET | Freq: Two times a day (BID) | ORAL | Status: DC
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
## 2014-05-16 NOTE — Addendum Note (Signed)
Addended by: Aggie Hacker A on: 05/16/2014 03:11 PM   Modules accepted: Orders

## 2014-05-16 NOTE — Progress Notes (Signed)
Pre visit review using our clinic review tool, if applicable. No additional management support is needed unless otherwise documented below in the visit note. 

## 2014-05-26 DIAGNOSIS — Z0279 Encounter for issue of other medical certificate: Secondary | ICD-10-CM

## 2016-01-25 ENCOUNTER — Other Ambulatory Visit (INDEPENDENT_AMBULATORY_CARE_PROVIDER_SITE_OTHER): Payer: BLUE CROSS/BLUE SHIELD

## 2016-01-25 DIAGNOSIS — Z0000 Encounter for general adult medical examination without abnormal findings: Secondary | ICD-10-CM | POA: Diagnosis not present

## 2016-01-25 LAB — CBC WITH DIFFERENTIAL/PLATELET
Basophils Absolute: 0 10*3/uL (ref 0.0–0.1)
Basophils Relative: 0.5 % (ref 0.0–3.0)
Eosinophils Absolute: 0.2 10*3/uL (ref 0.0–0.7)
Eosinophils Relative: 3 % (ref 0.0–5.0)
HCT: 45.2 % (ref 39.0–52.0)
Hemoglobin: 15.4 g/dL (ref 13.0–17.0)
Lymphocytes Relative: 26.6 % (ref 12.0–46.0)
Lymphs Abs: 1.5 10*3/uL (ref 0.7–4.0)
MCHC: 34.1 g/dL (ref 30.0–36.0)
MCV: 91.7 fl (ref 78.0–100.0)
Monocytes Absolute: 0.5 10*3/uL (ref 0.1–1.0)
Monocytes Relative: 8.7 % (ref 3.0–12.0)
Neutro Abs: 3.3 10*3/uL (ref 1.4–7.7)
Neutrophils Relative %: 61.2 % (ref 43.0–77.0)
Platelets: 202 10*3/uL (ref 150.0–400.0)
RBC: 4.94 Mil/uL (ref 4.22–5.81)
RDW: 13.2 % (ref 11.5–15.5)
WBC: 5.5 10*3/uL (ref 4.0–10.5)

## 2016-01-25 LAB — POC URINALSYSI DIPSTICK (AUTOMATED)
Bilirubin, UA: NEGATIVE
Blood, UA: NEGATIVE
Glucose, UA: NEGATIVE
Ketones, UA: NEGATIVE
Leukocytes, UA: NEGATIVE
Nitrite, UA: NEGATIVE
Protein, UA: NEGATIVE
Spec Grav, UA: 1.01
Urobilinogen, UA: 0.2
pH, UA: 7

## 2016-01-25 LAB — BASIC METABOLIC PANEL
BUN: 10 mg/dL (ref 6–23)
CO2: 30 mEq/L (ref 19–32)
Calcium: 9.5 mg/dL (ref 8.4–10.5)
Chloride: 106 mEq/L (ref 96–112)
Creatinine, Ser: 0.98 mg/dL (ref 0.40–1.50)
GFR: 84.66 mL/min (ref >60.00)
Glucose, Bld: 112 mg/dL — ABNORMAL HIGH (ref 70–99)
Potassium: 4.4 mEq/L (ref 3.5–5.1)
Sodium: 144 mEq/L (ref 135–145)

## 2016-01-25 LAB — LIPID PANEL
Cholesterol: 163 mg/dL (ref 0–200)
HDL: 47.9 mg/dL (ref >39.00)
LDL Cholesterol: 101 mg/dL — ABNORMAL HIGH (ref 0–99)
NonHDL: 115.5
Total CHOL/HDL Ratio: 3
Triglycerides: 71 mg/dL (ref 0.0–149.0)
VLDL: 14.2 mg/dL (ref 0.0–40.0)

## 2016-01-25 LAB — HEPATIC FUNCTION PANEL
ALT: 36 U/L (ref 0–53)
AST: 34 U/L (ref 0–37)
Albumin: 4.5 g/dL (ref 3.5–5.2)
Alkaline Phosphatase: 55 U/L (ref 39–117)
Bilirubin, Direct: 0.1 mg/dL (ref 0.0–0.3)
Total Bilirubin: 0.5 mg/dL (ref 0.2–1.2)
Total Protein: 6.7 g/dL (ref 6.0–8.3)

## 2016-01-25 LAB — TSH: TSH: 1.32 u[IU]/mL (ref 0.35–4.50)

## 2016-01-25 LAB — PSA: PSA: 1.67 ng/mL (ref 0.10–4.00)

## 2016-02-05 ENCOUNTER — Encounter: Payer: Self-pay | Admitting: Family Medicine

## 2016-02-05 ENCOUNTER — Ambulatory Visit (INDEPENDENT_AMBULATORY_CARE_PROVIDER_SITE_OTHER): Payer: BLUE CROSS/BLUE SHIELD | Admitting: Family Medicine

## 2016-02-05 VITALS — BP 104/58 | HR 67 | Temp 97.9°F | Ht 73.0 in | Wt 218.0 lb

## 2016-02-05 DIAGNOSIS — Z23 Encounter for immunization: Secondary | ICD-10-CM

## 2016-02-05 DIAGNOSIS — Z0000 Encounter for general adult medical examination without abnormal findings: Secondary | ICD-10-CM

## 2016-02-05 NOTE — Progress Notes (Signed)
Pre visit review using our clinic review tool, if applicable. No additional management support is needed unless otherwise documented below in the visit note. 

## 2016-02-05 NOTE — Progress Notes (Signed)
   Subjective:    Patient ID: Tanner Hughes, male    DOB: 01-18-1961, 55 y.o.   MRN: [id number]  HPI 55 yr old male for a cpx. He feels fine and has only one question. He has had some trouble lately falling asleep and he thinks it may be from thinking about work issues. He finds it hard to relax at times. He exercises regularly.    Review of Systems  Constitutional: Negative.   HENT: Negative.   Eyes: Negative.   Respiratory: Negative.   Cardiovascular: Negative.   Gastrointestinal: Negative.   Genitourinary: Negative.   Musculoskeletal: Negative.   Skin: Negative.   Neurological: Negative.   Psychiatric/Behavioral: Negative.        Objective:   Physical Exam  Constitutional: He is oriented to person, place, and time. He appears well-developed and well-nourished. No distress.  HENT:  Head: Normocephalic and atraumatic.  Right Ear: External ear normal.  Left Ear: External ear normal.  Nose: Nose normal.  Mouth/Throat: Oropharynx is clear and moist. No oropharyngeal exudate.  Eyes: Conjunctivae and EOM are normal. Pupils are equal, round, and reactive to light. Right eye exhibits no discharge. Left eye exhibits no discharge. No scleral icterus.  Neck: Neck supple. No JVD present. No tracheal deviation present. No thyromegaly present.  Cardiovascular: Normal rate, regular rhythm, normal heart sounds and intact distal pulses.  Exam reveals no gallop and no friction rub.   No murmur heard. EKG normal   Pulmonary/Chest: Effort normal and breath sounds normal. No respiratory distress. He has no wheezes. He has no rales. He exhibits no tenderness.  Abdominal: Soft. Bowel sounds are normal. He exhibits no distension and no mass. There is no tenderness. There is no rebound and no guarding.  Genitourinary: Rectum normal, prostate normal and penis normal. Guaiac negative stool. No penile tenderness.  Musculoskeletal: Normal range of motion. He exhibits no edema or tenderness.    Lymphadenopathy:    He has no cervical adenopathy.  Neurological: He is alert and oriented to person, place, and time. He has normal reflexes. No cranial nerve deficit. He exhibits normal muscle tone. Coordination normal.  Skin: Skin is warm and dry. No rash noted. He is not diaphoretic. No erythema. No pallor.  Psychiatric: He has a normal mood and affect. His behavior is normal. Judgment and thought content normal.          Assessment & Plan:  Well exam. We we discussed diet and exercise. His glucose is elevated and he has a family hx of diabetes, so we discussed reducing his carb intake. He can try OTC melatonin for sleep.

## 2016-02-06 ENCOUNTER — Encounter: Payer: Self-pay | Admitting: Family Medicine

## 2016-02-06 NOTE — Telephone Encounter (Signed)
Call in Lorazepam 1 mg every 8 hours prn anxiety, #60 with one rf

## 2016-02-07 MED ORDER — LORAZEPAM 1 MG PO TABS: 1.0000 mg | ORAL_TABLET | Freq: Three times a day (TID) | ORAL | Status: DC | PRN

## 2016-03-30 ENCOUNTER — Ambulatory Visit (INDEPENDENT_AMBULATORY_CARE_PROVIDER_SITE_OTHER): Payer: Managed Care, Other (non HMO) | Admitting: Physician Assistant

## 2016-03-30 VITALS — BP 122/70 | HR 65 | Temp 98.1°F | Resp 16 | Ht 73.0 in | Wt 217.4 lb

## 2016-03-30 DIAGNOSIS — S39012A Strain of muscle, fascia and tendon of lower back, initial encounter: Secondary | ICD-10-CM

## 2016-03-30 MED ORDER — CYCLOBENZAPRINE HCL ER 15 MG PO CP24: 15.0000 mg | ORAL_CAPSULE | Freq: Every day | ORAL | Status: DC | PRN
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
## 2016-03-30 NOTE — Patient Instructions (Addendum)
Use ibuprofen 600 mg three times daily. May also use tylenol with this if needed Take amrix 12 hours before you want to wake up, this is a muscle relaxer Heat, gentle massage and gentle stretching can help. Remain active, as inactivity can cause more pain. Don't do anything too strenuous though. If you develop new numbness, weakness or incontinence of urine or bowel, return to clinic or go to the emergency room ASAP. Return if your symptoms are not improving in 2 weeks.     IF you received an x-ray today, you will receive an invoice from [facility]. Please contact [facility] at [phone] with questions or concerns regarding your invoice.   IF you received labwork today, you will receive an invoice from [employer]. Please contact [facility] at [phone] with questions or concerns regarding your invoice.   Our billing staff will not be able to assist you with questions regarding bills from these companies.  You will be contacted with the lab results as soon as they are available. The fastest way to get your results is to activate your My Chart account. Instructions are located on the last page of this paperwork. If you have not heard from [place] regarding the results in 2 weeks, please contact this office.

## 2016-12-03 DIAGNOSIS — M25521 Pain in right elbow: Secondary | ICD-10-CM | POA: Diagnosis not present

## 2016-12-03 DIAGNOSIS — M7711 Lateral epicondylitis, right elbow: Secondary | ICD-10-CM | POA: Diagnosis not present

## 2016-12-03 DIAGNOSIS — M25621 Stiffness of right elbow, not elsewhere classified: Secondary | ICD-10-CM | POA: Diagnosis not present

## 2016-12-03 DIAGNOSIS — M25531 Pain in right wrist: Secondary | ICD-10-CM | POA: Diagnosis not present

## 2016-12-08 DIAGNOSIS — M25521 Pain in right elbow: Secondary | ICD-10-CM | POA: Diagnosis not present

## 2016-12-08 DIAGNOSIS — M7711 Lateral epicondylitis, right elbow: Secondary | ICD-10-CM | POA: Diagnosis not present

## 2016-12-08 DIAGNOSIS — M25531 Pain in right wrist: Secondary | ICD-10-CM | POA: Diagnosis not present

## 2016-12-08 DIAGNOSIS — M25621 Stiffness of right elbow, not elsewhere classified: Secondary | ICD-10-CM | POA: Diagnosis not present

## 2016-12-09 DIAGNOSIS — L821 Other seborrheic keratosis: Secondary | ICD-10-CM | POA: Diagnosis not present

## 2016-12-09 DIAGNOSIS — L814 Other melanin hyperpigmentation: Secondary | ICD-10-CM | POA: Diagnosis not present

## 2016-12-09 DIAGNOSIS — L570 Actinic keratosis: Secondary | ICD-10-CM | POA: Diagnosis not present

## 2016-12-09 DIAGNOSIS — L578 Other skin changes due to chronic exposure to nonionizing radiation: Secondary | ICD-10-CM | POA: Diagnosis not present

## 2016-12-09 DIAGNOSIS — D225 Melanocytic nevi of trunk: Secondary | ICD-10-CM | POA: Diagnosis not present

## 2016-12-12 DIAGNOSIS — M25531 Pain in right wrist: Secondary | ICD-10-CM | POA: Diagnosis not present

## 2016-12-12 DIAGNOSIS — M25621 Stiffness of right elbow, not elsewhere classified: Secondary | ICD-10-CM | POA: Diagnosis not present

## 2016-12-12 DIAGNOSIS — M25521 Pain in right elbow: Secondary | ICD-10-CM | POA: Diagnosis not present

## 2016-12-12 DIAGNOSIS — M7711 Lateral epicondylitis, right elbow: Secondary | ICD-10-CM | POA: Diagnosis not present

## 2016-12-17 DIAGNOSIS — M25531 Pain in right wrist: Secondary | ICD-10-CM | POA: Diagnosis not present

## 2016-12-17 DIAGNOSIS — M7711 Lateral epicondylitis, right elbow: Secondary | ICD-10-CM | POA: Diagnosis not present

## 2016-12-17 DIAGNOSIS — M25621 Stiffness of right elbow, not elsewhere classified: Secondary | ICD-10-CM | POA: Diagnosis not present

## 2016-12-17 DIAGNOSIS — M25521 Pain in right elbow: Secondary | ICD-10-CM | POA: Diagnosis not present

## 2016-12-26 ENCOUNTER — Ambulatory Visit (INDEPENDENT_AMBULATORY_CARE_PROVIDER_SITE_OTHER): Payer: BLUE CROSS/BLUE SHIELD | Admitting: Physician Assistant

## 2016-12-26 VITALS — BP 120/74 | HR 76 | Temp 98.1°F | Resp 16 | Ht 73.0 in | Wt 222.4 lb

## 2016-12-26 DIAGNOSIS — J069 Acute upper respiratory infection, unspecified: Secondary | ICD-10-CM | POA: Diagnosis not present

## 2016-12-26 MED ORDER — AZITHROMYCIN 250 MG PO TABS: ORAL_TABLET | ORAL | 0 refills | Status: DC

## 2016-12-26 MED ORDER — IPRATROPIUM BROMIDE 0.03 % NA SOLN: 2.0000 | Freq: Two times a day (BID) | NASAL | 0 refills | Status: DC

## 2016-12-26 MED ORDER — HYDROCOD POLST-CPM POLST ER 10-8 MG/5ML PO SUER: 5.0000 mL | Freq: Every evening | ORAL | 0 refills | Status: DC | PRN

## 2016-12-26 MED ORDER — GUAIFENESIN ER 1200 MG PO TB12: 1.0000 | ORAL_TABLET | Freq: Two times a day (BID) | ORAL | 1 refills | Status: DC | PRN

## 2016-12-26 NOTE — Progress Notes (Signed)
[facility] [address] [place] [address] [phone]  Date:  12/26/2016   Name:  Tanner Hughes   DOB:  1961-01-15   MRN:  [id number]  PCP:  Alysia Penna, MD    History of Present Illness:  Tanner Hughes is a 55 y.o. male patient who presents to [facility] for cc of cough.   4-5 days of coughing productive yellow and green sputum.  Sleeping sitting up because the cough gets worse.  No sob or dyspnea.  Fatigued, and malaise.  No ear pain.  There is sore throat and rhinorrhea.  Sudafed and mucinex which help temporary.  Tylenol last administered 2pm today.   He took zicam which helps.    Patient Active Problem List   Diagnosis Date Noted  . Abdominal pain of unknown etiology 08/05/2012  . Personal history of colonic polyps - adenoma 03/22/2012  . HYPERTENSION 08/16/2007    Past Medical History:  Diagnosis Date  . Hypertension     Past Surgical History:  Procedure Laterality Date  . COLONOSCOPY  03-15-12   per Dr. Carlean Purl, adenoma, repeat in 5 yrs     Social History  Substance Use Topics  . Smoking status: Never Smoker  . Smokeless tobacco: Never Used  . Alcohol use 4.2 oz/week    7 Standard drinks or equivalent per week     Comment: 1/daily    Family History  Problem Relation Age of Onset  . Diabetes Father   . Hyperlipidemia Father   . Depression    . Diabetes    . Hyperlipidemia    . Hypertension    . Stroke    . Heart disease    . Diabetes Maternal Grandmother   . Stroke Maternal Grandmother   . Colon cancer Neg Hx   . Stomach cancer Neg Hx     No Known Allergies  Medication list has been reviewed and updated.  Current Outpatient Prescriptions on File Prior to Visit  Medication Sig Dispense Refill  . cyclobenzaprine (AMRIX) 15 MG 24 hr capsule Take 1 capsule (15 mg total) by mouth daily as needed for muscle spasms. (Patient not taking: Reported on 12/26/2016) 30 capsule 0  . LORazepam (ATIVAN) 1 MG tablet Take 1 tablet (1 mg total)  by mouth every 8 (eight) hours as needed for anxiety. (Patient not taking: Reported on 12/26/2016) 60 tablet 1   No current facility-administered medications on file prior to visit.     ROS ROS otherwise unremarkable unless listed above.  Physical Examination: BP 120/74 (BP Location: Right Arm, Patient Position: Sitting, Cuff Size: Small)   Pulse 76   Temp 98.1 F (36.7 C) (Oral)   Resp 16   Ht 6\' 1"  (1.854 m)   Wt 222 lb 6.4 oz (100.9 kg)   SpO2 96%   BMI 29.34 kg/m  Ideal Body Weight: Weight in (lb) to have BMI = 25: 189.1  Physical Exam  Constitutional: He is oriented to person, place, and time. He appears well-developed and well-nourished. No distress.  HENT:  Head: Atraumatic.  Right Ear: Tympanic membrane, external ear and ear canal normal.  Left Ear: Tympanic membrane, external ear and ear canal normal.  Nose: Mucosal edema and rhinorrhea present. Right sinus exhibits no maxillary sinus tenderness and no frontal sinus tenderness. Left sinus exhibits no maxillary sinus tenderness and no frontal sinus tenderness.  Mouth/Throat: No uvula swelling. No oropharyngeal exudate, posterior oropharyngeal edema or posterior oropharyngeal erythema.  Eyes: Conjunctivae,  EOM and lids are normal. Pupils are equal, round, and reactive to light. Right eye exhibits normal extraocular motion. Left eye exhibits normal extraocular motion.  Neck: Trachea normal and full passive range of motion without pain. No edema and no erythema present.  Cardiovascular: Normal rate.   Pulmonary/Chest: Effort normal. No respiratory distress. He has no decreased breath sounds. He has no wheezes. He has no rhonchi.  Neurological: He is alert and oriented to person, place, and time.  Skin: Skin is warm and dry. He is not diaphoretic.  Psychiatric: He has a normal mood and affect. His behavior is normal.     Assessment and Plan: Tanner Hughes is a 55 y.o. male who is here today for cc of cough.  If he is not  having improvement of his symptoms within 4 days, please give him azithromycin in a zpak form.   Print of z-pak given as he is leaving town.  If symptoms worsen. Acute upper respiratory infection - Plan: Guaifenesin (MUCINEX MAXIMUM STRENGTH) 1200 MG TB12, ipratropium (ATROVENT) 0.03 % nasal spray, chlorpheniramine-HYDROcodone (TUSSIONEX PENNKINETIC ER) 10-8 MG/5ML SUER, azithromycin (ZITHROMAX) 250 MG tablet   Ivar Drape, PA-C [facility] 12/26/2016 6:57 PM

## 2016-12-26 NOTE — Patient Instructions (Addendum)
Upper Respiratory Infection, Adult Most upper respiratory infections (URIs) are caused by a virus. A URI affects the nose, throat, and upper air passages. The most common type of URI is often called "the common cold." Follow these instructions at home:  Take medicines only as told by your doctor.  Gargle warm saltwater or take cough drops to comfort your throat as told by your doctor.  Use a warm mist humidifier or inhale steam from a shower to increase air moisture. This may make it easier to breathe.  Drink enough fluid to keep your pee (urine) clear or pale yellow.  Eat soups and other clear broths.  Have a healthy diet.  Rest as needed.  Go back to work when your fever is gone or your doctor says it is okay.  You may need to stay home longer to avoid giving your URI to others.  You can also wear a face mask and wash your hands often to prevent spread of the virus.  Use your inhaler more if you have asthma.  Do not use any tobacco products, including cigarettes, chewing tobacco, or electronic cigarettes. If you need help quitting, ask your doctor. Contact a doctor if:  You are getting worse, not better.  Your symptoms are not helped by medicine.  You have chills.  You are getting more short of breath.  You have brown or red mucus.  You have yellow or brown discharge from your nose.  You have pain in your face, especially when you bend forward.  You have a fever.  You have puffy (swollen) neck glands.  You have pain while swallowing.  You have white areas in the back of your throat. Get help right away if:  You have very bad or constant:  Headache.  Ear pain.  Pain in your forehead, behind your eyes, and over your cheekbones (sinus pain).  Chest pain.  You have long-lasting (chronic) lung disease and any of the following:  Wheezing.  Long-lasting cough.  Coughing up blood.  A change in your usual mucus.  You have a stiff neck.  You have  changes in your:  Vision.  Hearing.  Thinking.  Mood. This information is not intended to replace advice given to you by your health care provider. Make sure you discuss any questions you have with your health care provider. Document Released: 06/02/2008 Document Revised: 08/17/2016 Document Reviewed: 03/22/2014 Elsevier Interactive Patient Education  2017 [employer]vier Inc.     IF you received an x-ray today, you will receive an invoice from [facility]. Please contact [facility] at [phone] with questions or concerns regarding your invoice.   IF you received labwork today, you will receive an invoice from [facility]. Please contact [facility] at [phone] with questions or concerns regarding your invoice.   Our billing staff will not be able to assist you with questions regarding bills from these companies.  You will be contacted with the lab results as soon as they are available. The fastest way to get your results is to activate your My Chart account. Instructions are located on the last page of this paperwork. If you have not heard from us regarding the results in 2 weeks, please contact this office.      

## 2016-12-30 DIAGNOSIS — M25531 Pain in right wrist: Secondary | ICD-10-CM | POA: Diagnosis not present

## 2016-12-30 DIAGNOSIS — M25621 Stiffness of right elbow, not elsewhere classified: Secondary | ICD-10-CM | POA: Diagnosis not present

## 2016-12-30 DIAGNOSIS — M25521 Pain in right elbow: Secondary | ICD-10-CM | POA: Diagnosis not present

## 2016-12-30 DIAGNOSIS — M7711 Lateral epicondylitis, right elbow: Secondary | ICD-10-CM | POA: Diagnosis not present

## 2017-01-23 ENCOUNTER — Other Ambulatory Visit (INDEPENDENT_AMBULATORY_CARE_PROVIDER_SITE_OTHER): Payer: Self-pay

## 2017-01-23 DIAGNOSIS — Z0000 Encounter for general adult medical examination without abnormal findings: Secondary | ICD-10-CM

## 2017-01-23 LAB — POC URINALSYSI DIPSTICK (AUTOMATED)
Bilirubin, UA: NEGATIVE
Blood, UA: NEGATIVE
Glucose, UA: NEGATIVE
Ketones, UA: NEGATIVE
Leukocytes, UA: NEGATIVE
Nitrite, UA: NEGATIVE
Protein, UA: NEGATIVE
Spec Grav, UA: 1.01
Urobilinogen, UA: 0.2
pH, UA: 7

## 2017-01-23 LAB — CBC WITH DIFFERENTIAL/PLATELET
Basophils Absolute: 0.1 10*3/uL (ref 0.0–0.1)
Basophils Relative: 0.6 % (ref 0.0–3.0)
Eosinophils Absolute: 0.1 10*3/uL (ref 0.0–0.7)
Eosinophils Relative: 1.6 % (ref 0.0–5.0)
HCT: 42.5 % (ref 39.0–52.0)
Hemoglobin: 14.6 g/dL (ref 13.0–17.0)
Lymphocytes Relative: 20.1 % (ref 12.0–46.0)
Lymphs Abs: 1.6 10*3/uL (ref 0.7–4.0)
MCHC: 34.5 g/dL (ref 30.0–36.0)
MCV: 89.6 fl (ref 78.0–100.0)
Monocytes Absolute: 0.6 10*3/uL (ref 0.1–1.0)
Monocytes Relative: 7.4 % (ref 3.0–12.0)
Neutro Abs: 5.8 10*3/uL (ref 1.4–7.7)
Neutrophils Relative %: 70.3 % (ref 43.0–77.0)
Platelets: 177 10*3/uL (ref 150.0–400.0)
RBC: 4.74 Mil/uL (ref 4.22–5.81)
RDW: 13.4 % (ref 11.5–15.5)
WBC: 8.2 10*3/uL (ref 4.0–10.5)

## 2017-01-23 LAB — BASIC METABOLIC PANEL
BUN: 17 mg/dL (ref 6–23)
CO2: 26 mEq/L (ref 19–32)
Calcium: 9.1 mg/dL (ref 8.4–10.5)
Chloride: 106 mEq/L (ref 96–112)
Creatinine, Ser: 0.9 mg/dL (ref 0.40–1.50)
GFR: 93.05 mL/min (ref >60.00)
Glucose, Bld: 100 mg/dL — ABNORMAL HIGH (ref 70–99)
Potassium: 3.9 mEq/L (ref 3.5–5.1)
Sodium: 140 mEq/L (ref 135–145)

## 2017-01-23 LAB — LIPID PANEL
Cholesterol: 150 mg/dL (ref 0–200)
HDL: 45.1 mg/dL (ref >39.00)
LDL Cholesterol: 98 mg/dL (ref 0–99)
NonHDL: 105.09
Total CHOL/HDL Ratio: 3
Triglycerides: 36 mg/dL (ref 0.0–149.0)
VLDL: 7.2 mg/dL (ref 0.0–40.0)

## 2017-01-23 LAB — PSA: PSA: 3.26 ng/mL (ref 0.10–4.00)

## 2017-01-23 LAB — HEPATIC FUNCTION PANEL
ALT: 37 U/L (ref 0–53)
AST: 33 U/L (ref 0–37)
Albumin: 4.2 g/dL (ref 3.5–5.2)
Alkaline Phosphatase: 56 U/L (ref 39–117)
Bilirubin, Direct: 0.1 mg/dL (ref 0.0–0.3)
Total Bilirubin: 0.5 mg/dL (ref 0.2–1.2)
Total Protein: 6.5 g/dL (ref 6.0–8.3)

## 2017-01-23 LAB — TSH: TSH: 1.23 u[IU]/mL (ref 0.35–4.50)

## 2017-02-05 ENCOUNTER — Encounter: Payer: BLUE CROSS/BLUE SHIELD | Admitting: Family Medicine

## 2017-02-09 DIAGNOSIS — L570 Actinic keratosis: Secondary | ICD-10-CM | POA: Diagnosis not present

## 2017-02-12 ENCOUNTER — Encounter: Payer: Self-pay | Admitting: Internal Medicine

## 2017-02-18 ENCOUNTER — Encounter: Payer: Self-pay | Admitting: Family Medicine

## 2017-03-10 ENCOUNTER — Ambulatory Visit (INDEPENDENT_AMBULATORY_CARE_PROVIDER_SITE_OTHER): Payer: BLUE CROSS/BLUE SHIELD | Admitting: Family Medicine

## 2017-03-10 ENCOUNTER — Encounter: Payer: Self-pay | Admitting: Family Medicine

## 2017-03-10 VITALS — BP 112/76 | HR 68 | Temp 98.5°F | Ht 73.0 in | Wt 213.0 lb

## 2017-03-10 DIAGNOSIS — Z23 Encounter for immunization: Secondary | ICD-10-CM | POA: Diagnosis not present

## 2017-03-10 DIAGNOSIS — R972 Elevated prostate specific antigen [PSA]: Secondary | ICD-10-CM | POA: Diagnosis not present

## 2017-03-10 DIAGNOSIS — Z0000 Encounter for general adult medical examination without abnormal findings: Secondary | ICD-10-CM

## 2017-03-10 NOTE — Addendum Note (Signed)
Addended by: Aggie Hacker A on: 03/10/2017 03:14 PM   Modules accepted: Orders

## 2017-03-10 NOTE — Patient Instructions (Signed)
WE NOW OFFER    [facility] FAST TRACK!!!  SAME DAY Appointments for ACUTE CARE  Such as: Sprains, Injuries, cuts, abrasions, rashes, muscle pain, joint pain, back pain Colds, flu, sore throats, headache, allergies, cough, fever  Ear pain, sinus and eye infections Abdominal pain, nausea, vomiting, diarrhea, upset stomach Animal/insect bites  3 Easy Ways to Schedule: Walk-In Scheduling Call in scheduling Mychart Sign-up: [email or web address]         

## 2017-03-10 NOTE — Progress Notes (Signed)
   Subjective:    Patient ID: Tanner Hughes, male    DOB: September 14, 1961, 56 y.o.   MRN: [id number]  HPI 56 yr old male for a well exam. He feels fine.    Review of Systems  Constitutional: Negative.   HENT: Negative.   Eyes: Negative.   Respiratory: Negative.   Cardiovascular: Negative.   Gastrointestinal: Negative.   Genitourinary: Negative.   Musculoskeletal: Negative.   Skin: Negative.   Neurological: Negative.   Psychiatric/Behavioral: Negative.        Objective:   Physical Exam  Constitutional: He is oriented to person, place, and time. He appears well-developed and well-nourished. No distress.  HENT:  Head: Normocephalic and atraumatic.  Right Ear: External ear normal.  Left Ear: External ear normal.  Nose: Nose normal.  Mouth/Throat: Oropharynx is clear and moist. No oropharyngeal exudate.  Eyes: Conjunctivae and EOM are normal. Pupils are equal, round, and reactive to light. Right eye exhibits no discharge. Left eye exhibits no discharge. No scleral icterus.  Neck: Neck supple. No JVD present. No tracheal deviation present. No thyromegaly present.  Cardiovascular: Normal rate, regular rhythm, normal heart sounds and intact distal pulses.  Exam reveals no gallop and no friction rub.   No murmur heard. Pulmonary/Chest: Effort normal and breath sounds normal. No respiratory distress. He has no wheezes. He has no rales. He exhibits no tenderness.  Abdominal: Soft. Bowel sounds are normal. He exhibits no distension and no mass. There is no tenderness. There is no rebound and no guarding.  Genitourinary: Rectum normal, prostate normal and penis normal. Rectal exam shows guaiac negative stool. No penile tenderness.  Musculoskeletal: Normal range of motion. He exhibits no edema or tenderness.  Lymphadenopathy:    He has no cervical adenopathy.  Neurological: He is alert and oriented to person, place, and time. He has normal reflexes. No cranial nerve deficit. He exhibits normal  muscle tone. Coordination normal.  Skin: Skin is warm and dry. No rash noted. He is not diaphoretic. No erythema. No pallor.  Psychiatric: He has a normal mood and affect. His behavior is normal. Judgment and thought content normal.          Assessment & Plan:  Well exam. We discussed diet and exercise. Since his PSA almost doubled in a year, we agreed to check another PSA in 6 months to follow this closely.  Alysia Penna, MD

## 2017-03-10 NOTE — Progress Notes (Signed)
Pre visit review using our clinic review tool, if applicable. No additional management support is needed unless otherwise documented below in the visit note. 

## 2017-03-11 ENCOUNTER — Encounter: Payer: Self-pay | Admitting: Internal Medicine

## 2017-04-27 ENCOUNTER — Ambulatory Visit (AMBULATORY_SURGERY_CENTER): Payer: Self-pay

## 2017-04-27 VITALS — Ht 73.0 in | Wt 211.0 lb

## 2017-04-27 DIAGNOSIS — Z860100 Personal history of colon polyps, unspecified: Secondary | ICD-10-CM

## 2017-04-27 NOTE — Progress Notes (Signed)
No allergies to eggs or soy No diet meds No home oxygen No past problems with anesthesia  Registered emmi 

## 2017-04-28 ENCOUNTER — Encounter: Payer: Self-pay | Admitting: Internal Medicine

## 2017-05-06 ENCOUNTER — Encounter: Payer: BLUE CROSS/BLUE SHIELD | Admitting: Internal Medicine

## 2017-05-19 ENCOUNTER — Ambulatory Visit (AMBULATORY_SURGERY_CENTER): Payer: BLUE CROSS/BLUE SHIELD | Admitting: Internal Medicine

## 2017-05-19 ENCOUNTER — Encounter: Payer: Self-pay | Admitting: Internal Medicine

## 2017-05-19 VITALS — BP 103/64 | HR 56 | Temp 98.4°F | Resp 21 | Ht 73.0 in | Wt 211.0 lb

## 2017-05-19 DIAGNOSIS — Z86010 Personal history of colonic polyps: Secondary | ICD-10-CM

## 2017-05-19 DIAGNOSIS — D120 Benign neoplasm of cecum: Secondary | ICD-10-CM

## 2017-05-19 DIAGNOSIS — D122 Benign neoplasm of ascending colon: Secondary | ICD-10-CM | POA: Diagnosis not present

## 2017-05-19 HISTORY — PX: COLONOSCOPY: SHX174

## 2017-05-19 MED ORDER — SODIUM CHLORIDE 0.9 % IV SOLN: 500.0000 mL | INTRAVENOUS | Status: DC

## 2017-05-19 NOTE — Progress Notes (Signed)
Report to PACU, RN, vss, BBS= Clear.  

## 2017-05-19 NOTE — Progress Notes (Signed)
Called to room to assist during endoscopic procedure.  Patient ID and intended procedure confirmed with present staff. Received instructions for my participation in the procedure from the performing physician.  

## 2017-05-19 NOTE — Patient Instructions (Addendum)
   I found and removed 2 small polyps. I will let you know pathology results and when to have another routine colonoscopy by mail and/or My Chart.  I appreciate the opportunity to care for you. Gatha Mayer, MD, FACG  YOU HAD AN ENDOSCOPIC PROCEDURE TODAY AT [facility]:   Refer to the procedure report that was given to you for any specific questions about what was found during the examination.  If the procedure report does not answer your questions, please call your gastroenterologist to clarify.  If you requested that your care partner not be given the details of your procedure findings, then the procedure report has been included in a sealed envelope for you to review at your convenience later.  YOU SHOULD EXPECT: Some feelings of bloating in the abdomen. Passage of more gas than usual.  Walking can help get rid of the air that was put into your GI tract during the procedure and reduce the bloating. If you had a lower endoscopy (such as a colonoscopy or flexible sigmoidoscopy) you may notice spotting of blood in your stool or on the toilet paper. If you underwent a bowel prep for your procedure, you may not have a normal bowel movement for a few days.  Please Note:  You might notice some irritation and congestion in your nose or some drainage.  This is from the oxygen used during your procedure.  There is no need for concern and it should clear up in a day or so.  SYMPTOMS TO REPORT IMMEDIATELY:   Following lower endoscopy (colonoscopy or flexible sigmoidoscopy):  Excessive amounts of blood in the stool  Significant tenderness or worsening of abdominal pains  Swelling of the abdomen that is new, acute  Fever of 100F or higher    For urgent or emergent issues, a gastroenterologist can be reached at any hour by calling [phone].   DIET:  We do recommend a small meal at first, but then you may proceed to your regular diet.  Drink plenty of fluids but you  should avoid alcoholic beverages for 24 hours.  ACTIVITY:  You should plan to take it easy for the rest of today and you should NOT DRIVE or use heavy machinery until tomorrow (because of the sedation medicines used during the test).    FOLLOW UP: Our staff will call the number listed on your records the next business day following your procedure to check on you and address any questions or concerns that you may have regarding the information given to you following your procedure. If we do not reach you, we will leave a message.  However, if you are feeling well and you are not experiencing any problems, there is no need to return our call.  We will assume that you have returned to your regular daily activities without incident.  If any biopsies were taken you will be contacted by phone or by letter within the next 1-3 weeks.  Please call us at [phone] if you have not heard about the biopsies in 3 weeks.    SIGNATURES/CONFIDENTIALITY: You and/or your care partner have signed paperwork which will be entered into your electronic medical record.  These signatures attest to the fact that that the information above on your After Visit Summary has been reviewed and is understood.  Full responsibility of the confidentiality of this discharge information lies with you and/or your care-partner.   Polyp and diverticulosis information given.

## 2017-05-19 NOTE — Op Note (Signed)
[facility] Patient Name: Tanner Hughes Procedure Date: 05/19/2017 2:36 PM MRN: [id number] Endoscopist: Gatha Mayer , MD Age: 56 Referring MD:  Date of Birth: 28-Jan-1961 Gender: Male Account #: [id number] Procedure:                Colonoscopy Indications:              Surveillance: Personal history of adenomatous                            polyps on last colonoscopy 5 years ago Medicines:                Propofol per Anesthesia, Monitored Anesthesia Care Procedure:                Pre-Anesthesia Assessment:                           - Prior to the procedure, a History and Physical                            was performed, and patient medications and                            allergies were reviewed. The patient's tolerance of                            previous anesthesia was also reviewed. The risks                            and benefits of the procedure and the sedation                            options and risks were discussed with the patient.                            All questions were answered, and informed consent                            was obtained. Prior Anticoagulants: The patient has                            taken no previous anticoagulant or antiplatelet                            agents. ASA Grade Assessment: II - A patient with                            mild systemic disease. After reviewing the risks                            and benefits, the patient was deemed in                            satisfactory condition to undergo the procedure.  After obtaining informed consent, the colonoscope                            was passed under direct vision. Throughout the                            procedure, the patient's blood pressure, pulse, and                            oxygen saturations were monitored continuously. The                            Colonoscope was introduced through the anus and   advanced to the the cecum, identified by                            appendiceal orifice and ileocecal valve. The                            colonoscopy was performed without difficulty. The                            patient tolerated the procedure well. The quality                            of the bowel preparation was excellent. The                            ileocecal valve, appendiceal orifice, and rectum                            were photographed. The bowel preparation used was                            Miralax. Scope In: 2:47:57 PM Scope Out: 3:09:08 PM Scope Withdrawal Time: 0 hours 18 minutes 5 seconds  Total Procedure Duration: 0 hours 21 minutes 11 seconds  Findings:                 The perianal and digital rectal examinations were                            normal. Pertinent negatives include normal prostate                            (size, shape, and consistency).                           Two sessile polyps were found in the ascending                            colon and cecum. The polyps were 4 to 8 mm in size.                            These polyps were removed with  a cold snare.                            Resection and retrieval were complete. Verification                            of patient identification for the specimen was                            done. Estimated blood loss was minimal.                           Multiple diverticula were found in the sigmoid                            colon.                           The exam was otherwise without abnormality on                            direct and retroflexion views. Complications:            No immediate complications. Estimated Blood Loss:     Estimated blood loss was minimal. Impression:               - Two 4 to 8 mm polyps in the ascending colon and                            in the cecum, removed with a cold snare. Resected                            and retrieved.                           -  Diverticulosis in the sigmoid colon.                           - The examination was otherwise normal on direct                            and retroflexion views. Recommendation:           - Patient has a contact number available for                            emergencies. The signs and symptoms of potential                            delayed complications were discussed with the                            patient. Return to normal activities tomorrow.                            Written discharge instructions were provided to the  patient.                           - Resume previous diet.                           - Continue present medications.                           - Repeat colonoscopy is recommended. The                            colonoscopy date will be determined after pathology                            results from today's exam become available for                            review. Gatha Mayer, MD 05/19/2017 3:15:11 PM This report has been signed electronically.

## 2017-05-20 ENCOUNTER — Telehealth: Payer: Self-pay

## 2017-05-20 ENCOUNTER — Telehealth: Payer: Self-pay | Admitting: *Deleted

## 2017-05-20 NOTE — Telephone Encounter (Signed)
Number identifier, left message. 

## 2017-05-20 NOTE — Telephone Encounter (Signed)
  Follow up Call-  Call back number 05/19/2017  Post procedure Call Back phone  # [phone]  Permission to leave phone message Yes  Some recent data might be hidden     No answer, left message.

## 2017-05-27 ENCOUNTER — Encounter: Payer: Self-pay | Admitting: Internal Medicine

## 2017-05-27 NOTE — Progress Notes (Signed)
2 subcm adenomas recall colon 2023 My Chart letter

## 2017-06-23 ENCOUNTER — Encounter: Payer: Self-pay | Admitting: *Deleted

## 2017-09-07 ENCOUNTER — Ambulatory Visit (INDEPENDENT_AMBULATORY_CARE_PROVIDER_SITE_OTHER): Payer: BLUE CROSS/BLUE SHIELD | Admitting: Family Medicine

## 2017-09-07 VITALS — BP 117/70 | HR 74 | Temp 98.3°F | Ht 73.0 in | Wt 217.0 lb

## 2017-09-07 DIAGNOSIS — N138 Other obstructive and reflux uropathy: Secondary | ICD-10-CM

## 2017-09-07 DIAGNOSIS — N401 Enlarged prostate with lower urinary tract symptoms: Secondary | ICD-10-CM

## 2017-09-07 DIAGNOSIS — R972 Elevated prostate specific antigen [PSA]: Secondary | ICD-10-CM | POA: Diagnosis not present

## 2017-09-07 LAB — PSA: PSA: 2.51 ng/mL (ref 0.10–4.00)

## 2017-09-07 NOTE — Patient Instructions (Signed)
WE NOW OFFER   [facility] FAST TRACK!!!  SAME DAY Appointments for ACUTE CARE  Such as: Sprains, Injuries, cuts, abrasions, rashes, muscle pain, joint pain, back pain Colds, flu, sore throats, headache, allergies, cough, fever  Ear pain, sinus and eye infections Abdominal pain, nausea, vomiting, diarrhea, upset stomach Animal/insect bites  3 Easy Ways to Schedule: Walk-In Scheduling Call in scheduling Mychart Sign-up: [email or web address]         

## 2017-09-08 ENCOUNTER — Encounter: Payer: Self-pay | Admitting: Family Medicine

## 2017-09-08 NOTE — Progress Notes (Signed)
   Subjective:    Patient ID: Tanner Hughes, male    DOB: 1961-05-30, 56 y.o.   MRN: [id number]  HPI Here to follow up his PSA. At his well exam 6 months ago his PSA had risen from 1.67 to 3.26, and we wanted to watch this closely. He feels fine.    Review of Systems  Constitutional: Negative.   Respiratory: Negative.   Cardiovascular: Negative.   Genitourinary: Negative.        Objective:   Physical Exam  Constitutional: He appears well-developed and well-nourished.  Neck: No thyromegaly present.  Cardiovascular: Normal rate, regular rhythm, normal heart sounds and intact distal pulses.   Pulmonary/Chest: Effort normal and breath sounds normal. No respiratory distress. He has no wheezes. He has no rales.  Lymphadenopathy:    He has no cervical adenopathy.          Assessment & Plan:  Rising PSA. We will repeat a level today. Alysia Penna, MD

## 2017-09-17 ENCOUNTER — Encounter: Payer: Self-pay | Admitting: Family Medicine

## 2018-02-05 DIAGNOSIS — D1801 Hemangioma of skin and subcutaneous tissue: Secondary | ICD-10-CM | POA: Diagnosis not present

## 2018-02-05 DIAGNOSIS — B353 Tinea pedis: Secondary | ICD-10-CM | POA: Diagnosis not present

## 2018-02-05 DIAGNOSIS — L570 Actinic keratosis: Secondary | ICD-10-CM | POA: Diagnosis not present

## 2018-02-05 DIAGNOSIS — L821 Other seborrheic keratosis: Secondary | ICD-10-CM | POA: Diagnosis not present

## 2018-02-05 DIAGNOSIS — D225 Melanocytic nevi of trunk: Secondary | ICD-10-CM | POA: Diagnosis not present

## 2018-04-25 DIAGNOSIS — R05 Cough: Secondary | ICD-10-CM | POA: Diagnosis not present

## 2018-04-25 DIAGNOSIS — J069 Acute upper respiratory infection, unspecified: Secondary | ICD-10-CM | POA: Diagnosis not present

## 2018-08-09 DIAGNOSIS — L578 Other skin changes due to chronic exposure to nonionizing radiation: Secondary | ICD-10-CM | POA: Diagnosis not present

## 2018-08-09 DIAGNOSIS — D485 Neoplasm of uncertain behavior of skin: Secondary | ICD-10-CM | POA: Diagnosis not present

## 2018-08-09 DIAGNOSIS — C4441 Basal cell carcinoma of skin of scalp and neck: Secondary | ICD-10-CM | POA: Diagnosis not present

## 2018-10-21 DIAGNOSIS — L905 Scar conditions and fibrosis of skin: Secondary | ICD-10-CM | POA: Diagnosis not present

## 2018-10-21 DIAGNOSIS — C4441 Basal cell carcinoma of skin of scalp and neck: Secondary | ICD-10-CM | POA: Diagnosis not present

## 2018-11-22 ENCOUNTER — Encounter: Payer: Self-pay | Admitting: Family Medicine

## 2018-11-22 ENCOUNTER — Ambulatory Visit: Payer: BLUE CROSS/BLUE SHIELD | Admitting: Family Medicine

## 2018-11-22 VITALS — BP 124/82 | HR 71 | Temp 97.4°F | Ht 73.0 in | Wt 222.0 lb

## 2018-11-22 DIAGNOSIS — Z0000 Encounter for general adult medical examination without abnormal findings: Secondary | ICD-10-CM

## 2018-11-22 DIAGNOSIS — Z23 Encounter for immunization: Secondary | ICD-10-CM

## 2018-11-22 NOTE — Progress Notes (Signed)
   Subjective:    Patient ID: Tanner Hughes, male    DOB: 1961/03/13, 57 y.o.   MRN: [id number]  HPI Here for a well exam. He feels well.    Review of Systems  Constitutional: Negative.   HENT: Negative.   Eyes: Negative.   Respiratory: Negative.   Cardiovascular: Negative.   Gastrointestinal: Negative.   Genitourinary: Negative.   Musculoskeletal: Negative.   Skin: Negative.   Neurological: Negative.   Psychiatric/Behavioral: Negative.        Objective:   Physical Exam  Constitutional: He is oriented to person, place, and time. He appears well-developed and well-nourished. No distress.  HENT:  Head: Normocephalic and atraumatic.  Right Ear: External ear normal.  Left Ear: External ear normal.  Nose: Nose normal.  Mouth/Throat: Oropharynx is clear and moist. No oropharyngeal exudate.  Eyes: Pupils are equal, round, and reactive to light. Conjunctivae and EOM are normal. Right eye exhibits no discharge. Left eye exhibits no discharge. No scleral icterus.  Neck: Neck supple. No JVD present. No tracheal deviation present. No thyromegaly present.  Cardiovascular: Normal rate, regular rhythm, normal heart sounds and intact distal pulses. Exam reveals no gallop and no friction rub.  No murmur heard. Pulmonary/Chest: Effort normal and breath sounds normal. No respiratory distress. He has no wheezes. He has no rales. He exhibits no tenderness.  Abdominal: Soft. Bowel sounds are normal. He exhibits no distension and no mass. There is no tenderness. There is no rebound and no guarding.  Genitourinary: Rectum normal, prostate normal and penis normal. Rectal exam shows guaiac negative stool. No penile tenderness.  Musculoskeletal: Normal range of motion. He exhibits no edema or tenderness.  Lymphadenopathy:    He has no cervical adenopathy.  Neurological: He is alert and oriented to person, place, and time. He has normal reflexes. He displays normal reflexes. No cranial nerve deficit. He  exhibits normal muscle tone. Coordination normal.  Skin: Skin is warm and dry. No rash noted. He is not diaphoretic. No erythema. No pallor.  Psychiatric: He has a normal mood and affect. His behavior is normal. Judgment and thought content normal.          Assessment & Plan:  Well exam. We discussed diet and exercise. Get fasting labs soon. Alysia Penna, MD

## 2018-11-23 LAB — POC URINALSYSI DIPSTICK (AUTOMATED)
Bilirubin, UA: NEGATIVE
Blood, UA: NEGATIVE
Glucose, UA: NEGATIVE
Ketones, UA: NEGATIVE
Leukocytes, UA: NEGATIVE
Nitrite, UA: NEGATIVE
Protein, UA: NEGATIVE
Spec Grav, UA: 1.01 (ref 1.010–1.025)
Urobilinogen, UA: 0.2 E.U./dL
pH, UA: 7.5 (ref 5.0–8.0)

## 2018-11-23 LAB — LIPID PANEL
Cholesterol: 159 mg/dL (ref 0–200)
HDL: 47.8 mg/dL (ref >39.00)
LDL Cholesterol: 97 mg/dL (ref 0–99)
NonHDL: 110.88
Total CHOL/HDL Ratio: 3
Triglycerides: 69 mg/dL (ref 0.0–149.0)
VLDL: 13.8 mg/dL (ref 0.0–40.0)

## 2018-11-23 LAB — CBC WITH DIFFERENTIAL/PLATELET
Basophils Absolute: 0.1 10*3/uL (ref 0.0–0.1)
Basophils Relative: 1.2 % (ref 0.0–3.0)
Eosinophils Absolute: 0.1 10*3/uL (ref 0.0–0.7)
Eosinophils Relative: 2.3 % (ref 0.0–5.0)
HCT: 44.4 % (ref 39.0–52.0)
Hemoglobin: 15.2 g/dL (ref 13.0–17.0)
Lymphocytes Relative: 22.3 % (ref 12.0–46.0)
Lymphs Abs: 1.3 10*3/uL (ref 0.7–4.0)
MCHC: 34.2 g/dL (ref 30.0–36.0)
MCV: 92.2 fl (ref 78.0–100.0)
Monocytes Absolute: 0.5 10*3/uL (ref 0.1–1.0)
Monocytes Relative: 9.7 % (ref 3.0–12.0)
Neutro Abs: 3.6 10*3/uL (ref 1.4–7.7)
Neutrophils Relative %: 64.5 % (ref 43.0–77.0)
Platelets: 189 10*3/uL (ref 150.0–400.0)
RBC: 4.81 Mil/uL (ref 4.22–5.81)
RDW: 13.3 % (ref 11.5–15.5)
WBC: 5.6 10*3/uL (ref 4.0–10.5)

## 2018-11-23 LAB — BASIC METABOLIC PANEL
BUN: 15 mg/dL (ref 6–23)
CO2: 27 mEq/L (ref 19–32)
Calcium: 9.4 mg/dL (ref 8.4–10.5)
Chloride: 103 mEq/L (ref 96–112)
Creatinine, Ser: 0.96 mg/dL (ref 0.40–1.50)
GFR: 85.8 mL/min (ref >60.00)
Glucose, Bld: 104 mg/dL — ABNORMAL HIGH (ref 70–99)
Potassium: 4.3 mEq/L (ref 3.5–5.1)
Sodium: 137 mEq/L (ref 135–145)

## 2018-11-23 LAB — HEPATIC FUNCTION PANEL
ALT: 32 U/L (ref 0–53)
AST: 25 U/L (ref 0–37)
Albumin: 4.5 g/dL (ref 3.5–5.2)
Alkaline Phosphatase: 66 U/L (ref 39–117)
Bilirubin, Direct: 0.1 mg/dL (ref 0.0–0.3)
Total Bilirubin: 0.5 mg/dL (ref 0.2–1.2)
Total Protein: 6.6 g/dL (ref 6.0–8.3)

## 2018-11-23 LAB — PSA: PSA: 2.24 ng/mL (ref 0.10–4.00)

## 2018-11-23 LAB — TSH: TSH: 1.84 u[IU]/mL (ref 0.35–4.50)

## 2018-11-23 NOTE — Addendum Note (Signed)
Addended by: Gwynne Edinger on: 11/23/2018 09:45 AM   Modules accepted: Orders

## 2018-11-24 ENCOUNTER — Encounter: Payer: Self-pay | Admitting: *Deleted

## 2018-12-27 ENCOUNTER — Telehealth: Payer: Self-pay | Admitting: Family Medicine

## 2018-12-27 NOTE — Telephone Encounter (Signed)
Left message for patient to call back. CRM created 

## 2018-12-27 NOTE — Telephone Encounter (Signed)
Copied from [place] [id number]. Topic: General - Other >> Dec 27, 2018 11:39 AM Antonieta Iba C wrote: Reason for CRM: pt is calling in for lab results. Showing that they were sent to mychart. Pt says that he doesn't have access to his My-chart. Pt would like a call back.   CB: [phone]

## 2018-12-31 DIAGNOSIS — M21622 Bunionette of left foot: Secondary | ICD-10-CM | POA: Diagnosis not present

## 2018-12-31 DIAGNOSIS — M71571 Other bursitis, not elsewhere classified, right ankle and foot: Secondary | ICD-10-CM | POA: Diagnosis not present

## 2018-12-31 DIAGNOSIS — M71572 Other bursitis, not elsewhere classified, left ankle and foot: Secondary | ICD-10-CM | POA: Diagnosis not present

## 2018-12-31 DIAGNOSIS — M21621 Bunionette of right foot: Secondary | ICD-10-CM | POA: Diagnosis not present

## 2019-01-07 NOTE — Telephone Encounter (Signed)
lmomtcb x 2 for the pt to discuss lab results.

## 2019-01-10 NOTE — Telephone Encounter (Signed)
Patient calling back for labs. Nurse unavailable. Please contact patient @ [phone]

## 2019-01-10 NOTE — Telephone Encounter (Signed)
Message left for pt. To call back for lab results.

## 2019-01-20 ENCOUNTER — Telehealth: Payer: Self-pay | Admitting: Family Medicine

## 2019-01-20 NOTE — Telephone Encounter (Signed)
Message regarding results from Nov. 2019 read to patient; verbalizes understanding.

## 2019-04-07 DIAGNOSIS — M71571 Other bursitis, not elsewhere classified, right ankle and foot: Secondary | ICD-10-CM | POA: Diagnosis not present

## 2019-04-07 DIAGNOSIS — M71572 Other bursitis, not elsewhere classified, left ankle and foot: Secondary | ICD-10-CM | POA: Diagnosis not present

## 2019-06-03 ENCOUNTER — Encounter: Payer: Self-pay | Admitting: Family Medicine

## 2019-06-03 DIAGNOSIS — M25572 Pain in left ankle and joints of left foot: Secondary | ICD-10-CM | POA: Diagnosis not present

## 2019-06-03 DIAGNOSIS — M7752 Other enthesopathy of left foot: Secondary | ICD-10-CM | POA: Diagnosis not present

## 2019-06-06 NOTE — Telephone Encounter (Signed)
Dr. Fry please advise. Thanks  

## 2019-06-06 NOTE — Telephone Encounter (Signed)
It depends on where he gets this treatment. Who is the provider he is seeing?

## 2019-06-10 ENCOUNTER — Other Ambulatory Visit: Payer: Self-pay

## 2019-06-10 ENCOUNTER — Encounter: Payer: Self-pay | Admitting: Family Medicine

## 2019-06-10 ENCOUNTER — Ambulatory Visit (INDEPENDENT_AMBULATORY_CARE_PROVIDER_SITE_OTHER): Payer: Self-pay | Admitting: Family Medicine

## 2019-06-10 DIAGNOSIS — E291 Testicular hypofunction: Secondary | ICD-10-CM

## 2019-06-10 NOTE — Progress Notes (Signed)
Subjective:    Patient ID: Tanner Hughes, male    DOB: September 06, 1961, 58 y.o.   MRN: [id number]  HPI Virtual Visit via Video Note  I connected with the patient on 06/10/19 at  8:45 AM EDT by a video enabled telemedicine application and verified that I am speaking with the correct person using two identifiers.  Location patient: home Location provider:work or home office Persons participating in the virtual visit: patient, provider  I discussed the limitations of evaluation and management by telemedicine and the availability of in person appointments. The patient expressed understanding and agreed to proceed.   HPI: Here to ask about testosterone supplementation. He has been feeling more tired and run down the past year, and he [facility] to check his testosterone level. He was told it is low and they offered to treat it. He asks my opinion if this is safe. He had a well exam with [place] with a normal PSA last November.    ROS: See pertinent positives and negatives per HPI.  Past Medical History:  Diagnosis Date  . Hypertension   . Personal history of colonic polyps - adenoma 03/22/2012   Diminutive adenoma removed from cecum 02/2012 Carlean Purl) - anticipate routine repeat colonoscopy  approx 02/2017     Past Surgical History:  Procedure Laterality Date  . COLONOSCOPY  05/19/2017   per Dr. Carlean Purl, adenomas, repeat in 5 yrs   . WRIST SURGERY     fell through window    Family History  Problem Relation Age of Onset  . Diabetes Father   . Hyperlipidemia Father   . Depression Unknown   . Diabetes Unknown   . Hyperlipidemia Unknown   . Hypertension Unknown   . Stroke Unknown   . Heart disease Unknown   . Diabetes Maternal Grandmother   . Stroke Maternal Grandmother   . Colon cancer Neg Hx   . Stomach cancer Neg Hx      Current Outpatient Medications:  .  aspirin EC 81 MG tablet, Take 81 mg by mouth daily., Disp: , Rfl:  .  Glucosamine-Chondroit-Vit C-Mn  (GLUCOSAMINE 1500 COMPLEX PO), Take by mouth., Disp: , Rfl:  .  Multiple Vitamin (MULTIVITAMIN) tablet, Take 1 tablet by mouth daily., Disp: , Rfl:  .  Omega-3 1000 MG CAPS, Take 3 capsules by mouth., Disp: , Rfl:  .  OVER THE COUNTER MEDICATION, Tumeric, Disp: , Rfl:   EXAM:  VITALS per patient if applicable:  GENERAL: alert, oriented, appears well and in no acute distress  HEENT: atraumatic, conjunttiva clear, no obvious abnormalities on inspection of external nose and ears  NECK: normal movements of the head and neck  LUNGS: on inspection no signs of respiratory distress, breathing rate appears normal, no obvious gross SOB, gasping or wheezing  CV: no obvious cyanosis  MS: moves all visible extremities without noticeable abnormality  PSYCH/NEURO: pleasant and cooperative, no obvious depression or anxiety, speech and thought processing grossly intact  ASSESSMENT AND PLAN: Low testosterone. I told him it would be safe to treat this and he plans to do so. Recheck prn.  Alysia Penna, MD  Discussed the following assessment and plan:  No diagnosis found.     I discussed the assessment and treatment plan with the patient. The patient was provided an opportunity to ask questions and all were answered. The patient agreed with the plan and demonstrated an understanding of the instructions.   The patient was advised to call  back or seek an in-person evaluation if the symptoms worsen or if the condition fails to improve as anticipated.     Review of Systems     Objective:   Physical Exam        Assessment & Plan:

## 2019-07-04 DIAGNOSIS — M25572 Pain in left ankle and joints of left foot: Secondary | ICD-10-CM | POA: Diagnosis not present

## 2019-07-21 DIAGNOSIS — L814 Other melanin hyperpigmentation: Secondary | ICD-10-CM | POA: Diagnosis not present

## 2019-07-21 DIAGNOSIS — L819 Disorder of pigmentation, unspecified: Secondary | ICD-10-CM | POA: Diagnosis not present

## 2019-07-21 DIAGNOSIS — D229 Melanocytic nevi, unspecified: Secondary | ICD-10-CM | POA: Diagnosis not present

## 2019-07-21 DIAGNOSIS — L821 Other seborrheic keratosis: Secondary | ICD-10-CM | POA: Diagnosis not present

## 2019-07-21 DIAGNOSIS — L570 Actinic keratosis: Secondary | ICD-10-CM | POA: Diagnosis not present

## 2019-08-29 DIAGNOSIS — L578 Other skin changes due to chronic exposure to nonionizing radiation: Secondary | ICD-10-CM | POA: Diagnosis not present

## 2020-01-23 ENCOUNTER — Other Ambulatory Visit: Payer: Self-pay

## 2020-01-23 ENCOUNTER — Encounter: Payer: Self-pay | Admitting: Family Medicine

## 2020-01-23 ENCOUNTER — Ambulatory Visit (INDEPENDENT_AMBULATORY_CARE_PROVIDER_SITE_OTHER): Payer: BC Managed Care – PPO | Admitting: Family Medicine

## 2020-01-23 VITALS — BP 118/70 | HR 70 | Temp 98.1°F | Ht 73.0 in | Wt 241.4 lb

## 2020-01-23 DIAGNOSIS — Z0000 Encounter for general adult medical examination without abnormal findings: Secondary | ICD-10-CM

## 2020-01-23 NOTE — Progress Notes (Signed)
   Subjective:    Patient ID: Tanner Hughes, male    DOB: [birth year], 59 y.o.   MRN: [id number]  HPI Here for a well exam. He feels great.    Review of Systems  Constitutional: Negative.   HENT: Negative.   Eyes: Negative.   Respiratory: Negative.   Cardiovascular: Negative.   Gastrointestinal: Negative.   Genitourinary: Negative.   Musculoskeletal: Negative.   Skin: Negative.   Neurological: Negative.   Psychiatric/Behavioral: Negative.        Objective:   Physical Exam Constitutional:      General: He is not in acute distress.    Appearance: He is well-developed. He is not diaphoretic.  HENT:     Head: Normocephalic and atraumatic.     Right Ear: External ear normal.     Left Ear: External ear normal.     Nose: Nose normal.     Mouth/Throat:     Pharynx: No oropharyngeal exudate.  Eyes:     General: No scleral icterus.       Right eye: No discharge.        Left eye: No discharge.     Conjunctiva/sclera: Conjunctivae normal.     Pupils: Pupils are equal, round, and reactive to light.  Neck:     Thyroid: No thyromegaly.     Vascular: No JVD.     Trachea: No tracheal deviation.  Cardiovascular:     Rate and Rhythm: Normal rate and regular rhythm.     Heart sounds: Normal heart sounds. No murmur. No friction rub. No gallop.   Pulmonary:     Effort: Pulmonary effort is normal. No respiratory distress.     Breath sounds: Normal breath sounds. No wheezing or rales.  Chest:     Chest wall: No tenderness.  Abdominal:     General: Bowel sounds are normal. There is no distension.     Palpations: Abdomen is soft. There is no mass.     Tenderness: There is no abdominal tenderness. There is no guarding or rebound.  Genitourinary:    Penis: Normal. No tenderness.      Testes: Normal.     Prostate: Normal.     Rectum: Normal. Guaiac result negative.  Musculoskeletal:        General: No tenderness. Normal range of motion.     Cervical back: Neck supple.    Lymphadenopathy:     Cervical: No cervical adenopathy.  Skin:    General: Skin is warm and dry.     Coloration: Skin is not pale.     Findings: No erythema or rash.  Neurological:     Mental Status: He is alert and oriented to person, place, and time.     Cranial Nerves: No cranial nerve deficit.     Motor: No abnormal muscle tone.     Coordination: Coordination normal.     Deep Tendon Reflexes: Reflexes are normal and symmetric. Reflexes normal.  Psychiatric:        Behavior: Behavior normal.        Thought Content: Thought content normal.        Judgment: Judgment normal.           Assessment & Plan:  Well exam. We discussed diet and exercise. Get fasting labs.  Alysia Penna, MD

## 2020-01-27 ENCOUNTER — Other Ambulatory Visit: Payer: BC Managed Care – PPO

## 2020-01-30 DIAGNOSIS — Z03818 Encounter for observation for suspected exposure to other biological agents ruled out: Secondary | ICD-10-CM | POA: Diagnosis not present

## 2020-02-06 ENCOUNTER — Other Ambulatory Visit (INDEPENDENT_AMBULATORY_CARE_PROVIDER_SITE_OTHER): Payer: BC Managed Care – PPO

## 2020-02-06 ENCOUNTER — Other Ambulatory Visit: Payer: Self-pay

## 2020-02-06 DIAGNOSIS — Z0000 Encounter for general adult medical examination without abnormal findings: Secondary | ICD-10-CM

## 2020-02-06 LAB — CBC WITH DIFFERENTIAL/PLATELET
Basophils Absolute: 0.1 10*3/uL (ref 0.0–0.1)
Basophils Relative: 1 % (ref 0.0–3.0)
Eosinophils Absolute: 0.1 10*3/uL (ref 0.0–0.7)
Eosinophils Relative: 1.2 % (ref 0.0–5.0)
HCT: 48.2 % (ref 39.0–52.0)
Hemoglobin: 16.3 g/dL (ref 13.0–17.0)
Lymphocytes Relative: 25.5 % (ref 12.0–46.0)
Lymphs Abs: 1.6 10*3/uL (ref 0.7–4.0)
MCHC: 33.8 g/dL (ref 30.0–36.0)
MCV: 88.7 fl (ref 78.0–100.0)
Monocytes Absolute: 0.6 10*3/uL (ref 0.1–1.0)
Monocytes Relative: 9.1 % (ref 3.0–12.0)
Neutro Abs: 4 10*3/uL (ref 1.4–7.7)
Neutrophils Relative %: 63.2 % (ref 43.0–77.0)
Platelets: 205 10*3/uL (ref 150.0–400.0)
RBC: 5.44 Mil/uL (ref 4.22–5.81)
RDW: 14.6 % (ref 11.5–15.5)
WBC: 6.3 10*3/uL (ref 4.0–10.5)

## 2020-02-06 LAB — BASIC METABOLIC PANEL
BUN: 18 mg/dL (ref 6–23)
CO2: 29 mEq/L (ref 19–32)
Calcium: 9.3 mg/dL (ref 8.4–10.5)
Chloride: 103 mEq/L (ref 96–112)
Creatinine, Ser: 1.14 mg/dL (ref 0.40–1.50)
GFR: 65.93 mL/min (ref >60.00)
Glucose, Bld: 87 mg/dL (ref 70–99)
Potassium: 4.2 mEq/L (ref 3.5–5.1)
Sodium: 139 mEq/L (ref 135–145)

## 2020-02-06 LAB — LIPID PANEL
Cholesterol: 159 mg/dL (ref 0–200)
HDL: 34 mg/dL — ABNORMAL LOW (ref >39.00)
LDL Cholesterol: 107 mg/dL — ABNORMAL HIGH (ref 0–99)
NonHDL: 124.6
Total CHOL/HDL Ratio: 5
Triglycerides: 86 mg/dL (ref 0.0–149.0)
VLDL: 17.2 mg/dL (ref 0.0–40.0)

## 2020-02-06 LAB — PSA: PSA: 2.75 ng/mL (ref 0.10–4.00)

## 2020-02-06 LAB — HEPATIC FUNCTION PANEL
ALT: 43 U/L (ref 0–53)
AST: 34 U/L (ref 0–37)
Albumin: 4.5 g/dL (ref 3.5–5.2)
Alkaline Phosphatase: 62 U/L (ref 39–117)
Bilirubin, Direct: 0.1 mg/dL (ref 0.0–0.3)
Total Bilirubin: 0.6 mg/dL (ref 0.2–1.2)
Total Protein: 6.9 g/dL (ref 6.0–8.3)

## 2020-02-06 LAB — TSH: TSH: 2.18 u[IU]/mL (ref 0.35–4.50)

## 2020-02-13 DIAGNOSIS — L089 Local infection of the skin and subcutaneous tissue, unspecified: Secondary | ICD-10-CM | POA: Diagnosis not present

## 2020-02-13 DIAGNOSIS — Z79899 Other long term (current) drug therapy: Secondary | ICD-10-CM | POA: Diagnosis not present

## 2020-02-13 DIAGNOSIS — F325 Major depressive disorder, single episode, in full remission: Secondary | ICD-10-CM | POA: Diagnosis not present

## 2020-03-05 DIAGNOSIS — L821 Other seborrheic keratosis: Secondary | ICD-10-CM | POA: Diagnosis not present

## 2020-03-05 DIAGNOSIS — L905 Scar conditions and fibrosis of skin: Secondary | ICD-10-CM | POA: Diagnosis not present

## 2020-03-05 DIAGNOSIS — L570 Actinic keratosis: Secondary | ICD-10-CM | POA: Diagnosis not present

## 2020-03-05 DIAGNOSIS — L819 Disorder of pigmentation, unspecified: Secondary | ICD-10-CM | POA: Diagnosis not present

## 2020-03-05 DIAGNOSIS — L814 Other melanin hyperpigmentation: Secondary | ICD-10-CM | POA: Diagnosis not present

## 2020-07-24 DIAGNOSIS — L570 Actinic keratosis: Secondary | ICD-10-CM | POA: Diagnosis not present

## 2020-07-24 DIAGNOSIS — L821 Other seborrheic keratosis: Secondary | ICD-10-CM | POA: Diagnosis not present

## 2020-07-24 DIAGNOSIS — D225 Melanocytic nevi of trunk: Secondary | ICD-10-CM | POA: Diagnosis not present

## 2021-01-24 ENCOUNTER — Encounter: Payer: BC Managed Care – PPO | Admitting: Family Medicine

## 2021-01-28 DIAGNOSIS — D1801 Hemangioma of skin and subcutaneous tissue: Secondary | ICD-10-CM | POA: Diagnosis not present

## 2021-01-28 DIAGNOSIS — L570 Actinic keratosis: Secondary | ICD-10-CM | POA: Diagnosis not present

## 2021-01-28 DIAGNOSIS — L821 Other seborrheic keratosis: Secondary | ICD-10-CM | POA: Diagnosis not present

## 2021-01-28 DIAGNOSIS — L905 Scar conditions and fibrosis of skin: Secondary | ICD-10-CM | POA: Diagnosis not present

## 2021-01-28 DIAGNOSIS — L812 Freckles: Secondary | ICD-10-CM | POA: Diagnosis not present

## 2021-02-04 ENCOUNTER — Encounter: Payer: BC Managed Care – PPO | Admitting: Family Medicine

## 2021-02-11 ENCOUNTER — Encounter: Payer: Self-pay | Admitting: Family Medicine

## 2021-02-11 ENCOUNTER — Ambulatory Visit (INDEPENDENT_AMBULATORY_CARE_PROVIDER_SITE_OTHER): Payer: BC Managed Care – PPO | Admitting: Family Medicine

## 2021-02-11 ENCOUNTER — Other Ambulatory Visit: Payer: Self-pay

## 2021-02-11 VITALS — BP 134/82 | HR 63 | Ht 73.0 in | Wt 214.2 lb

## 2021-02-11 DIAGNOSIS — Z0000 Encounter for general adult medical examination without abnormal findings: Secondary | ICD-10-CM

## 2021-02-11 DIAGNOSIS — Z125 Encounter for screening for malignant neoplasm of prostate: Secondary | ICD-10-CM

## 2021-02-11 LAB — CBC WITH DIFFERENTIAL/PLATELET
Basophils Absolute: 0.1 10*3/uL (ref 0.0–0.1)
Basophils Relative: 1.5 % (ref 0.0–3.0)
Eosinophils Absolute: 0.1 10*3/uL (ref 0.0–0.7)
Eosinophils Relative: 1.4 % (ref 0.0–5.0)
HCT: 47.1 % (ref 39.0–52.0)
Hemoglobin: 16 g/dL (ref 13.0–17.0)
Lymphocytes Relative: 28.1 % (ref 12.0–46.0)
Lymphs Abs: 1.7 10*3/uL (ref 0.7–4.0)
MCHC: 33.9 g/dL (ref 30.0–36.0)
MCV: 89.6 fl (ref 78.0–100.0)
Monocytes Absolute: 0.6 10*3/uL (ref 0.1–1.0)
Monocytes Relative: 10.5 % (ref 3.0–12.0)
Neutro Abs: 3.5 10*3/uL (ref 1.4–7.7)
Neutrophils Relative %: 58.5 % (ref 43.0–77.0)
Platelets: 212 10*3/uL (ref 150.0–400.0)
RBC: 5.25 Mil/uL (ref 4.22–5.81)
RDW: 14.2 % (ref 11.5–15.5)
WBC: 6 10*3/uL (ref 4.0–10.5)

## 2021-02-11 LAB — HEPATIC FUNCTION PANEL
ALT: 35 U/L (ref 0–53)
AST: 39 U/L — ABNORMAL HIGH (ref 0–37)
Albumin: 4.5 g/dL (ref 3.5–5.2)
Alkaline Phosphatase: 56 U/L (ref 39–117)
Bilirubin, Direct: 0.1 mg/dL (ref 0.0–0.3)
Total Bilirubin: 0.8 mg/dL (ref 0.2–1.2)
Total Protein: 7.1 g/dL (ref 6.0–8.3)

## 2021-02-11 LAB — BASIC METABOLIC PANEL
BUN: 17 mg/dL (ref 6–23)
CO2: 25 mEq/L (ref 19–32)
Calcium: 9.6 mg/dL (ref 8.4–10.5)
Chloride: 103 mEq/L (ref 96–112)
Creatinine, Ser: 1.18 mg/dL (ref 0.40–1.50)
GFR: 67.67 mL/min (ref >60.00)
Glucose, Bld: 84 mg/dL (ref 70–99)
Potassium: 4.2 mEq/L (ref 3.5–5.1)
Sodium: 138 mEq/L (ref 135–145)

## 2021-02-11 LAB — LIPID PANEL
Cholesterol: 165 mg/dL (ref 0–200)
HDL: 51.6 mg/dL (ref >39.00)
LDL Cholesterol: 102 mg/dL — ABNORMAL HIGH (ref 0–99)
NonHDL: 113.62
Total CHOL/HDL Ratio: 3
Triglycerides: 60 mg/dL (ref 0.0–149.0)
VLDL: 12 mg/dL (ref 0.0–40.0)

## 2021-02-11 LAB — TSH: TSH: 2.74 u[IU]/mL (ref 0.35–4.50)

## 2021-02-11 LAB — PSA: PSA: 3.59 ng/mL (ref 0.10–4.00)

## 2021-02-11 NOTE — Progress Notes (Signed)
Subjective:    Patient ID: RAFORD BRISSETT, male    DOB: 11-26-1961, 60 y.o.   MRN: [id number]  HPI Here for a well exam. He feels fine.    Review of Systems  Constitutional: Negative.   HENT: Negative.   Eyes: Negative.   Respiratory: Negative.   Cardiovascular: Negative.   Gastrointestinal: Negative.   Genitourinary: Negative.   Musculoskeletal: Negative.   Skin: Negative.   Neurological: Negative.   Psychiatric/Behavioral: Negative.        Objective:   Physical Exam Constitutional:      General: He is not in acute distress.    Appearance: He is well-developed and well-nourished. He is not diaphoretic.  HENT:     Head: Normocephalic and atraumatic.     Right Ear: External ear normal.     Left Ear: External ear normal.     Nose: Nose normal.     Mouth/Throat:     Mouth: Oropharynx is clear and moist.     Pharynx: No oropharyngeal exudate.  Eyes:     General: No scleral icterus.       Right eye: No discharge.        Left eye: No discharge.     Extraocular Movements: EOM normal.     Conjunctiva/sclera: Conjunctivae normal.     Pupils: Pupils are equal, round, and reactive to light.  Neck:     Thyroid: No thyromegaly.     Vascular: No JVD.     Trachea: No tracheal deviation.  Cardiovascular:     Rate and Rhythm: Normal rate and regular rhythm.     Pulses: Intact distal pulses.     Heart sounds: Normal heart sounds. No murmur heard. No friction rub. No gallop.   Pulmonary:     Effort: Pulmonary effort is normal. No respiratory distress.     Breath sounds: Normal breath sounds. No wheezing or rales.  Chest:     Chest wall: No tenderness.  Abdominal:     General: Bowel sounds are normal. There is no distension.     Palpations: Abdomen is soft. There is no mass.     Tenderness: There is no abdominal tenderness. There is no guarding or rebound.  Genitourinary:    Penis: Normal. No tenderness.      Testes: Normal.     Prostate: Normal.     Rectum: Normal.  Guaiac result negative.  Musculoskeletal:        General: No tenderness or edema. Normal range of motion.     Cervical back: Neck supple.  Lymphadenopathy:     Cervical: No cervical adenopathy.  Skin:    General: Skin is warm and dry.     Coloration: Skin is not pale.     Findings: No erythema or rash.  Neurological:     Mental Status: He is alert and oriented to person, place, and time.     Cranial Nerves: No cranial nerve deficit.     Motor: No abnormal muscle tone.     Coordination: Coordination normal.     Deep Tendon Reflexes: Reflexes are normal and symmetric. Reflexes normal.  Psychiatric:        Mood and Affect: Mood and affect normal.        Behavior: Behavior normal.        Thought Content: Thought content normal.        Judgment: Judgment normal.           Assessment & Plan:  Well exam. We  discussed diet and exercise. Get fasting labs.  Alysia Penna, MD

## 2021-06-25 ENCOUNTER — Telehealth: Payer: Self-pay | Admitting: Family Medicine

## 2021-06-25 NOTE — Telephone Encounter (Signed)
The patient had labs done at a different location and his PSA was 4.1. He was wanting to know if he needs to see you to have you refer him to a Endocrinologist or can you just send  a referral. He is also sending the results of his PSA through MyChart message to Dr. Sarajane Jews.

## 2021-06-26 NOTE — Telephone Encounter (Signed)
At this time no My Chart message has been sent with results from patient. Please advise

## 2021-06-27 NOTE — Telephone Encounter (Signed)
LVM for pt to drop off lab results, or send in via My Chart.

## 2021-06-27 NOTE — Telephone Encounter (Signed)
I will  need to see these results first. By the way, he would need to see a Urologist and not an Musician

## 2021-06-28 ENCOUNTER — Encounter: Payer: Self-pay | Admitting: Family Medicine

## 2021-07-02 NOTE — Telephone Encounter (Signed)
Set up an OV so we can discuss this. Have him bring a copy of these results also

## 2021-07-04 ENCOUNTER — Other Ambulatory Visit: Payer: Self-pay

## 2021-07-05 ENCOUNTER — Encounter: Payer: Self-pay | Admitting: Family Medicine

## 2021-07-05 ENCOUNTER — Ambulatory Visit (INDEPENDENT_AMBULATORY_CARE_PROVIDER_SITE_OTHER): Payer: 59 | Admitting: Family Medicine

## 2021-07-05 VITALS — BP 110/78 | HR 78 | Temp 98.2°F | Wt 202.0 lb

## 2021-07-05 DIAGNOSIS — R972 Elevated prostate specific antigen [PSA]: Secondary | ICD-10-CM

## 2021-07-05 NOTE — Progress Notes (Signed)
   Subjective:    Patient ID: Tanner Hughes, male    DOB: 04-03-1961, 60 y.o.   MRN: [id number]  HPI Here to discuss some recent lab results. For the past 2 years he has been obtaining testosterone replacement from an online company called [employer] out of [place], [place] [phone]). On 06-13-21 he had complete lab work done through them and his PSA returned at 4.3. This was 3.59 at his well exam here in February. He feels fine.    Review of Systems  Constitutional: Negative.   Respiratory: Negative.    Cardiovascular: Negative.       Objective:   Physical Exam Constitutional:      Appearance: Normal appearance.  Cardiovascular:     Rate and Rhythm: Normal rate and regular rhythm.     Pulses: Normal pulses.     Heart sounds: Normal heart sounds.  Pulmonary:     Effort: Pulmonary effort is normal.     Breath sounds: Normal breath sounds.  Neurological:     Mental Status: He is alert.          Assessment & Plan:  Elevated PSA. I asked him to email [place] exactly what dose of testosterone he is taking. We will refer him to Urology to evaluate this further.  Alysia Penna, MD

## 2021-07-15 NOTE — Telephone Encounter (Signed)
Noted. It may take a few weeks for Urology to contact him. He can also call them to speed things up

## 2021-10-08 ENCOUNTER — Ambulatory Visit
Admission: RE | Admit: 2021-10-08 | Discharge: 2021-10-08 | Disposition: A | Discharge status: Home / Self Care | Payer: 59 | Source: Ambulatory Visit | Attending: Radiation Oncology | Admitting: Radiation Oncology

## 2021-10-08 ENCOUNTER — Encounter: Payer: Self-pay | Admitting: Radiation Oncology

## 2021-10-08 ENCOUNTER — Other Ambulatory Visit: Payer: Self-pay

## 2021-10-08 VITALS — BP 109/77 | HR 64 | Temp 97.7°F | Resp 18 | Ht 73.0 in | Wt 210.2 lb

## 2021-10-08 DIAGNOSIS — C61 Malignant neoplasm of prostate: Secondary | ICD-10-CM | POA: Insufficient documentation

## 2021-10-08 DIAGNOSIS — Z7982 Long term (current) use of aspirin: Secondary | ICD-10-CM | POA: Insufficient documentation

## 2021-10-08 DIAGNOSIS — Z86010 Personal history of colonic polyps: Secondary | ICD-10-CM | POA: Diagnosis not present

## 2021-10-08 DIAGNOSIS — Z79899 Other long term (current) drug therapy: Secondary | ICD-10-CM | POA: Diagnosis not present

## 2021-10-08 DIAGNOSIS — I10 Essential (primary) hypertension: Secondary | ICD-10-CM | POA: Diagnosis not present

## 2021-10-08 DIAGNOSIS — E291 Testicular hypofunction: Secondary | ICD-10-CM | POA: Insufficient documentation

## 2021-10-08 NOTE — Progress Notes (Signed)
GU Location of Tumor / Histology: Prostate Ca  If Prostate Cancer, Gleason Score is ( 3+ 3) and PSA is (4.09 as of 8/22)  Biopsies  Dr. Gloriann Loan      Past/Anticipated interventions by urology, if any:   Past/Anticipated interventions by medical oncology, if any:   Weight changes, if any: No, stable at this time.  IPSS:  11 SHIM:  15  Bowel/Bladder complaints, if any:  No bowel or bladder issues at this time.  Nausea/Vomiting, if any: No  Pain issues, if any:  0/10 scale  SAFETY ISSUES: Prior radiation?  No Pacemaker/ICD?  No Possible current pregnancy? Male Is the patient on methotrexate? No  Current Complaints / other details:  Need information on illness.  Seed radiation or any other treatment options.

## 2021-10-08 NOTE — Progress Notes (Signed)
Radiation Oncology         [phone] ________________________________  Initial Outpatient Consultation  Name: SHERON ROBIN MRN: [id number]  Date: 10/08/2021  DOB: May 07, 1961  CC:Fry, Ishmael Holter, MD  Lucas Mallow, MD   REFERRING PHYSICIAN: Lucas Mallow, MD  DIAGNOSIS: 60 y.o. gentleman with Stage T1c adenocarcinoma of the prostate with Gleason score of 3+4, and PSA of 4.3.    ICD-10-CM   1. Malignant neoplasm of prostate ([place])  [facility] referral to Social Work      HISTORY OF PRESENT ILLNESS: WILDER KUROWSKI is a 60 y.o. male with a diagnosis of prostate cancer. He was noted to have an elevated PSA of 4.3 on 06/13/21 while undergoing routine lab work for hypogonadism.  His hypogonadism has been managed with monthly IM TRT with most recent testosterone at 1500.  Accordingly, he was referred for evaluation in urology by Dr. Gloriann Loan on 08/20/21,  digital rectal examination was performed at that time revealing no nodules.  A repeat PSA 08/21/21 remained elevated at 4.09. Therefore, the patient proceeded to transrectal ultrasound with 12 biopsies of the prostate on 09/24/21.  The prostate volume measured 39.78 cc.  Out of 12 core biopsies, 2 were positive.  The maximum Gleason score was 3+4, and this was seen in the left base. Additionally, Gleason 3+3 was seen in the left base lateral.  The patient reviewed the biopsy results with his urologist and he has kindly been referred today for discussion of potential radiation treatment options.   PREVIOUS RADIATION THERAPY: No  PAST MEDICAL HISTORY:  Past Medical History:  Diagnosis Date   Hypertension    Personal history of colonic polyps - adenoma 03/22/2012   Diminutive adenoma removed from cecum 02/2012 Carlean Purl) - anticipate routine repeat colonoscopy  approx 02/2017       PAST SURGICAL HISTORY: Past Surgical History:  Procedure Laterality Date   COLONOSCOPY  05/19/2017   per Dr. Carlean Purl, adenomas, repeat in 5 yrs    WRIST  SURGERY     fell through window    FAMILY HISTORY:  Family History  Problem Relation Age of Onset   Diabetes Father    Hyperlipidemia Father    Single kidney Father    Diabetes Maternal Grandmother    Stroke Maternal Grandmother    Depression Other    Diabetes Other    Hyperlipidemia Other    Hypertension Other    Stroke Other    Heart disease Other    Colon cancer Neg Hx    Stomach cancer Neg Hx     SOCIAL HISTORY:  Social History   Socioeconomic History   Marital status: Married    Spouse name: Not on file   Number of children: Not on file   Years of education: Not on file   Highest education level: Not on file  Occupational History   Not on file  Tobacco Use   Smoking status: Never   Smokeless tobacco: Never  Vaping Use   Vaping Use: Never used  Substance and Sexual Activity   Alcohol use: Yes    Alcohol/week: 7.0 standard drinks    Types: 7 Standard drinks or equivalent per week    Comment: 1 every other day   Drug use: No   Sexual activity: Yes  Other Topics Concern   Not on file  Social History Narrative   Occupation: Press photographer for an Therapist, [employer]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  MEDICATIONS:  Current Outpatient Medications  Medication Sig Dispense Refill   aspirin EC 81 MG tablet Take 81 mg by mouth daily.     Glucosamine-Chondroit-Vit C-Mn (GLUCOSAMINE 1500 COMPLEX PO) Take by mouth.     Multiple Vitamin (MULTIVITAMIN) tablet Take 1 tablet by mouth daily.     Omega-3 1000 MG CAPS Take 3 capsules by mouth.     OVER THE COUNTER MEDICATION Tumeric     Testosterone Cypionate 200 MG/ML SOLN Inject as directed once a week.     No current  facility-administered medications for this encounter.    REVIEW OF SYSTEMS:  On review of systems, the patient reports that he is doing well overall. He denies any chest pain, shortness of breath, cough, fevers, chills, night sweats, unintended weight changes. He denies any bowel disturbances, and denies abdominal pain, nausea or vomiting. He denies any new musculoskeletal or joint aches or pains. His IPSS was 11, indicating mild urinary symptoms. His SHIM was 15, indicating he has moderate erectile dysfunction. A complete review of systems is obtained and is otherwise negative.    PHYSICAL EXAM:  Wt Readings from Last 3 Encounters:  10/08/21 210 lb 3.2 oz (95.3 kg)  07/05/21 202 lb (91.6 kg)  02/11/21 214 lb 3.2 oz (97.2 kg)   Temp Readings from Last 3 Encounters:  10/08/21 97.7 F (36.5 C)  07/05/21 98.2 F (36.8 C) (Temporal)  01/23/20 98.1 F (36.7 C) (Temporal)   BP Readings from Last 3 Encounters:  10/08/21 109/77  07/05/21 110/78  02/11/21 134/82   Pulse Readings from Last 3 Encounters:  10/08/21 64  07/05/21 78  02/11/21 63   Pain Assessment Pain Score: 0-No pain/10  In general this is a well appearing Caucasian male in no acute distress. He's alert and oriented x4 and appropriate throughout the examination. Cardiopulmonary assessment is negative for acute distress, and he exhibits normal effort.     KPS = 100  100 - Normal; no complaints; no evidence of disease. 90   - Able to carry on normal activity; minor signs or symptoms of disease. 80   - Normal activity with effort; some signs or symptoms of disease. 46   - Cares for self; unable to carry on normal activity or to do active work. 60   - Requires occasional assistance, but is able to care for most of his personal needs. 50   - Requires considerable assistance and frequent medical care. 31   - Disabled; requires special care and assistance. 60   - Severely disabled; hospital admission is indicated although  death not imminent. 25   - Very sick; hospital admission necessary; active supportive treatment necessary. 10   - Moribund; fatal processes progressing rapidly. 0     - Dead  Karnofsky DA, Abelmann Corydon, Craver LS and Burchenal [facility] [phone]) The use of the nitrogen mustards in the palliative treatment of carcinoma: with particular reference to bronchogenic carcinoma Cancer 1 [phone]  LABORATORY DATA:  Lab Results  Component Value Date   WBC 6.0 02/11/2021   HGB 16.0 02/11/2021   HCT 47.1 02/11/2021   MCV 89.6 02/11/2021   PLT 212.0 02/11/2021  Lab Results  Component Value Date   NA 138 02/11/2021   K 4.2 02/11/2021   CL 103 02/11/2021   CO2 25 02/11/2021   Lab Results  Component Value Date   ALT 35 02/11/2021   AST 39 (H) 02/11/2021   ALKPHOS 56 02/11/2021   BILITOT 0.8 02/11/2021     RADIOGRAPHY: No results found.    IMPRESSION/PLAN: 1. 60 y.o. gentleman with Stage T1c adenocarcinoma of the prostate with Gleason Score of 3+4, and PSA of 4.3. We discussed the patient's workup and outlined the nature of prostate cancer in this setting. The patient's T stage, Gleason's score, and PSA put him into the favorable intermediate risk group with low volume disease. Accordingly, he is eligible for a variety of potential treatment options including active surveillance, brachytherapy, 5.5 weeks of external radiation, or prostatectomy. We discussed the available radiation techniques, and focused on the details and logistics of delivery. We discussed and outlined the risks, benefits, short and long-term effects associated with radiotherapy and compared and contrasted these with prostatectomy. We discussed the role of SpaceOAR gel in reducing the rectal toxicity associated with radiotherapy.  Today I reviewed the findings and workup thus far.  We discussed the natural history of prostate cancer.  We reviewed the the implications of T-stage, Gleason's Score, and PSA on decision-making and outcomes  related to prostate cancer.  We discussed radiation treatment in the management of prostate cancer with regard to the logistics and delivery of external beam radiation treatment as well as the logistics and delivery of prostate brachytherapy.  We compared and contrasted each of these approaches and also compared these against prostatectomy.    The patient focused most of his questions and interest in robotic-assisted laparoscopic radical prostatectomy.  We discussed some of the potential advantages of surgery including surgical staging, the availability of salvage radiotherapy to the prostatic fossa, and the confidence associated with immediate biochemical response.  We discussed some of the potential proven indications for postoperative radiotherapy including positive margins, extracapsular extension, and seminal vesicle involvement. We also talked about some of the other potential findings leading to a recommendation for radiotherapy including a non-zero postoperative PSA and positive lymph nodes. He appears to have a good understanding of his disease and our treatment recommendations which are of curative intent.  He was encouraged to ask questions that were answered to his stated satisfaction.  At the conclusion of our conversation, the patient would like to meet with Dr. Alinda Money to discuss proceeding with prostatectomy. We enjoyed meeting with him and his wife today, and will look forward to following his progress. He knows that he is welcome to contact us with any further questions or concerns related to radiation.  We personally spent 70 minutes in this encounter including chart review, reviewing radiological studies, meeting face-to-face with the patient and his wife, entering orders and completing documentation.    Nicholos Johns, PA-C    Tyler Pita, MD  [facility] Direct Dial: [phone]  Fax: [phone] .com  Skype  [email or web address]   This  document serves as a record of services personally performed by Tyler Pita, MD and Freeman Caldron, PA-C. It was created on their behalf by Wilburn Mylar, a trained medical scribe. The creation of this record is based on the scribe's personal observations and the provider's statements to them. This document has been checked and approved by the attending provider.

## 2021-10-09 ENCOUNTER — Encounter: Payer: Self-pay | Admitting: *Deleted

## 2021-10-09 NOTE — Progress Notes (Signed)
[facility] CSW Distress Screen  Social Work Intern called patient per distress screen protocol, level 7 indicating moderate distress. Left generic VM re Support Services with contact information for patient to return call.  ONCBCN DISTRESS SCREENING 10/08/2021  Screening Type Initial Screening  Distress experienced in past week (1-10) 7  Emotional problem type Nervousness/Anxiety  Spiritual/Religous concerns type Facing my mortality  Information Concerns Type Lack of info about treatment;Lack of info about diagnosis  Physical Problem type Sexual problems    Rosary Lively, BSW Intern Supervised by Gwinda Maine, LCSW

## 2021-10-10 ENCOUNTER — Encounter: Payer: Self-pay | Admitting: General Practice

## 2021-10-10 NOTE — Progress Notes (Signed)
[facility] Psychosocial Distress Screening Clinical Social Work  Clinical Social Work was referred by distress screening protocol.  The patient scored a 7 on the Psychosocial Distress Thermometer which indicates moderate distress. Clinical Social Worker contacted patient by phone to assess for distress and other psychosocial needs.   ONCBCN DISTRESS SCREENING 10/08/2021  Screening Type Initial Screening  Distress experienced in past week (1-10) 7  Emotional problem type Nervousness/Anxiety  Spiritual/Religous concerns type Facing my mortality  Information Concerns Type Lack of info about treatment;Lack of info about diagnosis  Physical Problem type Sexual problems    Clinical Social Worker follow up needed: No.  If yes, follow up plan:  Beverely Pace, [place], LCSW Clinical Social Worker Phone:  [phone]

## 2021-10-22 ENCOUNTER — Other Ambulatory Visit: Payer: Self-pay | Admitting: Urology

## 2021-11-11 NOTE — Patient Instructions (Signed)
DUE TO COVID-19 ONLY ONE VISITOR IS ALLOWED TO COME WITH YOU AND STAY IN THE WAITING ROOM ONLY DURING PRE OP AND PROCEDURE.   **NO VISITORS ARE ALLOWED IN THE SHORT STAY AREA OR RECOVERY ROOM!!**  IF YOU WILL BE ADMITTED INTO THE HOSPITAL YOU ARE ALLOWED ONLY TWO SUPPORT PEOPLE DURING VISITATION HOURS ONLY (7 AM -8PM)   The support person(s) must pass our screening, gel in and out, and wear a mask at all times, including in the patient's room. Patients must also wear a mask when staff or their support person are in the room. Visitors GUEST BADGE MUST BE WORN VISIBLY  One adult visitor may remain with you overnight and MUST be in the room by 8 P.M.  No visitors under the age of 74. Any visitor under the age of 101 must be accompanied by an adult.    COVID SWAB TESTING MUST BE COMPLETED ON:  12/03/21 **MUST PRESENT COMPLETED FORM AT TESTING SITE**    [place] (backside of the building) You are not required to quarantine, however you are required to wear a well-fitted mask when you are out and around people not in your household.  Hand Hygiene often Do NOT share personal items Notify your provider if you are in close contact with someone who has COVID or you develop fever 100.4 or greater, new onset of sneezing, cough, sore throat, shortness of breath or body aches.  [address], [address], must go inside of the hospital, NOT A DRIVE THRU!  (Must self quarantine after testing. Follow instructions on handout.)       Your procedure is scheduled on: 12/05/21   Report to [facility] Main Entrance    Report to short stay at : 5:15 AM   Call this number if you have problems the morning of surgery [phone]  CLEAR LIQUID DIET: Starting the day before surgery. May sure you drink plenty fluids during the prep.May have liquids until: 4:15 AM  day of surgery  Foods Allowed                                                                      Foods Excluded  Water, Black Coffee and tea, regular and decaf                             liquids that you cannot  Plain Jell-O in any flavor  (No red)                                           see through such as: Fruit ices (not with fruit pulp)                                     milk, soups, orange juice              Iced Popsicles (No red)  All solid food                                   Apple juices Sports drinks like Gatorade (No red) Lightly seasoned clear broth or consume(fat free) Sugar Sample Menu Breakfast                                Lunch                                     Supper Cranberry juice                    Beef broth                            Chicken broth Jell-O                                     Grape juice                           Apple juice Coffee or tea                        Jell-O                                      Popsicle                                                Coffee or tea                        Coffee or tea    Oral Hygiene is also important to reduce your risk of infection.                                    Remember - BRUSH YOUR TEETH THE MORNING OF SURGERY WITH YOUR REGULAR TOOTHPASTE   Do NOT smoke after Midnight   Take these medicines the morning of surgery with A SIP OF WATER:                               You may not have any metal on your body including hair pins, jewelry, and body piercing             Do not wear lotions, powders, perfumes/cologne, or deodorant  The day of surgery.               Men may shave face and neck.   Do not bring valuables to the hospital. [place].   Contacts, dentures or bridgework may not be worn into  surgery.   Bring small overnight bag day of surgery.    Patients discharged on the day of surgery will not be allowed to drive home.   Special Instructions: Bring a  copy of your healthcare power of attorney and living will documents         the day of surgery if you haven't scanned them before.              Please read over the following fact sheets you were given: IF YOU HAVE QUESTIONS ABOUT YOUR PRE-OP INSTRUCTIONS PLEASE CALL [phone]     [facility] - Preparing for Surgery Before surgery, you can play an important role.  Because skin is not sterile, your skin needs to be as free of germs as possible.  You can reduce the number of germs on your skin by washing with CHG (chlorahexidine gluconate) soap before surgery.  CHG is an antiseptic cleaner which kills germs and bonds with the skin to continue killing germs even after washing. Please DO NOT use if you have an allergy to CHG or antibacterial soaps.  If your skin becomes reddened/irritated stop using the CHG and inform your nurse when you arrive at Short Stay. Do not shave (including legs and underarms) for at least 48 hours prior to the first CHG shower.  You may shave your face/neck. Please follow these instructions carefully:  1.  Shower with CHG Soap the night before surgery and the  morning of Surgery.  2.  If you choose to wash your hair, wash your hair first as usual with your  normal  shampoo.  3.  After you shampoo, rinse your hair and body thoroughly to remove the  shampoo.                           4.  Use CHG as you would any other liquid soap.  You can apply chg directly  to the skin and wash                       Gently with a scrungie or clean washcloth.  5.  Apply the CHG Soap to your body ONLY FROM THE NECK DOWN.   Do not use on face/ open                           Wound or open sores. Avoid contact with eyes, ears mouth and genitals (private parts).                       Wash face,  Genitals (private parts) with your normal soap.             6.  Wash thoroughly, paying special attention to the area where your surgery  will be performed.  7.  Thoroughly rinse your body with warm water  from the neck down.  8.  DO NOT shower/wash with your normal soap after using and rinsing off  the CHG Soap.                9.  Pat yourself dry with a clean towel.            10.  Wear clean pajamas.            11.  Place clean sheets on your bed the night of your first shower and do not  sleep with pets.  Day of Surgery : Do not apply any lotions/deodorants the morning of surgery.  Please wear clean clothes to the hospital/surgery center.  FAILURE TO FOLLOW THESE INSTRUCTIONS MAY RESULT IN THE CANCELLATION OF YOUR SURGERY PATIENT SIGNATURE_________________________________  NURSE SIGNATURE__________________________________  ________________________________________________________________________  WHAT IS A BLOOD TRANSFUSION? Blood Transfusion Information  A transfusion is the replacement of blood or some of its parts. Blood is made up of multiple cells which provide different functions. Red blood cells carry oxygen and are used for blood loss replacement. White blood cells fight against infection. Platelets control bleeding. Plasma helps clot blood. Other blood products are available for specialized needs, such as hemophilia or other clotting disorders. BEFORE THE TRANSFUSION  Who gives blood for transfusions?  Healthy volunteers who are fully evaluated to make sure their blood is safe. This is blood bank blood. Transfusion therapy is the safest it has ever been in the practice of medicine. Before blood is taken from a donor, a complete history is taken to make sure that person has no history of diseases nor engages in risky social behavior (examples are intravenous drug use or sexual activity with multiple partners). The donor's travel history is screened to minimize risk of transmitting infections, such as malaria. The donated blood is tested for signs of infectious diseases, such as HIV and hepatitis. The blood is then tested to be sure it is compatible with you in order to minimize the  chance of a transfusion reaction. If you or a relative donates blood, this is often done in anticipation of surgery and is not appropriate for emergency situations. It takes many days to process the donated blood. RISKS AND COMPLICATIONS Although transfusion therapy is very safe and saves many lives, the main dangers of transfusion include:  Getting an infectious disease. Developing a transfusion reaction. This is an allergic reaction to something in the blood you were given. Every precaution is taken to prevent this. The decision to have a blood transfusion has been considered carefully by your caregiver before blood is given. Blood is not given unless the benefits outweigh the risks. AFTER THE TRANSFUSION Right after receiving a blood transfusion, you will usually feel much better and more energetic. This is especially true if your red blood cells have gotten low (anemic). The transfusion raises the level of the red blood cells which carry oxygen, and this usually causes an energy increase. The nurse administering the transfusion will monitor you carefully for complications. HOME CARE INSTRUCTIONS  No special instructions are needed after a transfusion. You may find your energy is better. Speak with your caregiver about any limitations on activity for underlying diseases you may have. SEEK MEDICAL CARE IF:  Your condition is not improving after your transfusion. You develop redness or irritation at the intravenous (IV) site. SEEK IMMEDIATE MEDICAL CARE IF:  Any of the following symptoms occur over the next 12 hours: Shaking chills. You have a temperature by mouth above 102 F (38.9 C), not controlled by medicine. Chest, back, or muscle pain. People around you feel you are not acting correctly or are confused. Shortness of breath or difficulty breathing. Dizziness and fainting. You get a rash or develop hives. You have a decrease in urine output. Your urine turns a dark color or changes to  pink, red, or brown. Any of the following symptoms occur over the next 10 days: You have a temperature by mouth above 102 F (38.9 C), not controlled by medicine. Shortness of breath. Weakness after normal activity. The white  part of the eye turns yellow (jaundice). You have a decrease in the amount of urine or are urinating less often. Your urine turns a dark color or changes to pink, red, or brown. Document Released: 12/12/2000 Document Revised: 03/08/2012 Document Reviewed: 07/31/2008 [facility] Patient Information 2014 [place], [place].  _______________________________________________________________________

## 2021-11-25 ENCOUNTER — Encounter (HOSPITAL_COMMUNITY): Payer: Self-pay

## 2021-11-25 ENCOUNTER — Other Ambulatory Visit: Payer: Self-pay

## 2021-11-25 ENCOUNTER — Encounter (HOSPITAL_COMMUNITY)
Admission: RE | Admit: 2021-11-25 | Discharge: 2021-11-25 | Disposition: A | Discharge status: Home / Self Care | Payer: 59 | Source: Ambulatory Visit | Attending: Urology | Admitting: Urology

## 2021-11-25 DIAGNOSIS — Z01818 Encounter for other preprocedural examination: Secondary | ICD-10-CM | POA: Diagnosis not present

## 2021-11-25 LAB — CBC
HCT: 44.6 % (ref 39.0–52.0)
Hemoglobin: 15.4 g/dL (ref 13.0–17.0)
MCH: 31.2 pg (ref 26.0–34.0)
MCHC: 34.5 g/dL (ref 30.0–36.0)
MCV: 90.3 fL (ref 80.0–100.0)
Platelets: 203 10*3/uL (ref 150–400)
RBC: 4.94 MIL/uL (ref 4.22–5.81)
RDW: 13.3 % (ref 11.5–15.5)
WBC: 6.5 10*3/uL (ref 4.0–10.5)
nRBC: 0 % (ref 0.0–0.2)

## 2021-11-25 LAB — BASIC METABOLIC PANEL
Anion gap: 7 (ref 5–15)
BUN: 20 mg/dL (ref 6–20)
CO2: 25 mmol/L (ref 22–32)
Calcium: 9.1 mg/dL (ref 8.9–10.3)
Chloride: 106 mmol/L (ref 98–111)
Creatinine, Ser: 1.33 mg/dL — ABNORMAL HIGH (ref 0.61–1.24)
GFR, Estimated: >60 mL/min (ref >60)
Glucose, Bld: 86 mg/dL (ref 70–99)
Potassium: 3.9 mmol/L (ref 3.5–5.1)
Sodium: 138 mmol/L (ref 135–145)

## 2021-11-25 NOTE — Progress Notes (Addendum)
COVID Vaccine Completed: Yes Date COVID Vaccine completed: 2021 x 3 COVID vaccine manufacturer: [employer] Test: 12/03/21 PCP - Dr. Alysia Penna Cardiologist -   Chest x-ray -  EKG -  Stress Test -  ECHO -  Cardiac Cath -  Pacemaker/ICD device last checked:  Sleep Study -  CPAP -   Fasting Blood Sugar -  Checks Blood Sugar _____ times a day  Blood Thinner Instructions: Aspirin Instructions: Last Dose:  Anesthesia review: Hx: HTN  Patient denies shortness of breath, fever, cough and chest pain at PAT appointment   Patient verbalized understanding of instructions that were given to them at the PAT appointment. Patient was also instructed that they will need to review over the PAT instructions again at home before surgery.

## 2021-11-28 DIAGNOSIS — C801 Malignant (primary) neoplasm, unspecified: Secondary | ICD-10-CM

## 2021-11-28 HISTORY — DX: Malignant (primary) neoplasm, unspecified: C80.1

## 2021-12-03 ENCOUNTER — Other Ambulatory Visit: Payer: Self-pay | Admitting: Urology

## 2021-12-03 LAB — SARS CORONAVIRUS 2 (TAT 6-24 HRS): SARS Coronavirus 2: NEGATIVE

## 2021-12-04 NOTE — H&P (Signed)
Office Visit Report     11/19/2021   --------------------------------------------------------------------------------   Tanner Hughes  MRN: [id number]  DOB: 06/19/1961, 60 year old Male  SSN:    PRIMARY CARE:  Annie Main A. Sarajane Jews, MD  REFERRING:  Wonda Cheng. Williams Che,   PROVIDER:  Wendie Simmer, M.D.  TREATING:  Jiles Crocker, NP  LOCATION:  [facility] [phone]     --------------------------------------------------------------------------------   CC/HPI: 11/19/2021: Here today for preoperative appointment prior to undergoing bilateral nerve sparing robot-assisted laparoscopic radical prostatectomy and bilateral pelvic lymphadenectomy with Dr Alinda Money on 12/08. Patient previously on TRT but has discontinued replacement at this time. Since last office visit he denies any changes to past medical history, prescription medications taken on a daily basis, no interval surgical or procedural intervention. He is voiding at his baseline with grossly stable nonbothersome symptomology. Denies any interval dysuria, gross hematuria. He has not had any recent fevers or chills, nausea/vomiting. Endorses normal daily bowel movements. Denies any recent chest pain, shortness of breath, paresthesias or numbness/tingling to the extremities.     CC: Prostate Cancer   Physician requesting consult: Dr. Alen Blew  PCP: Dr. Alysia Penna   Mr. Santerre is a 60 year old gentleman who was on testosterone replacement therapy via an online service. He had a screening PSA for this purpose that was elevated. His repeat PSA remained elevated at 4.09. 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   The reason that he was taking testosterone replacement was to help his energy level and libido/erectile function. It is unclear but it appears that his baseline testosterone level was approximately 300-400.   Family history:  None.   Imaging studies: None.   PMH: He has no major medical comorbidities.  PSH: No abdominal surgeries.   TNM stage: cT1c Nx Mx  PSA: 4.09  Gleason score: 3+4=7 (GG 2)  Biopsy (09/24/21): 2/12 cores positive  Left: L lateral base (10%, 3+3=6), L base (60%, 3+4=7)  Right: Benign  Prostate volume: 39.8 cc   Nomogram  OC disease: 77%  EPE: 21%  SVI: 3%  LNI: 2%  PFS (5 year, 10 year): 89%, 81%   Urinary function: IPSS is 7.  Erectile function: SHIM score is 15. He estimates that he could get an erection adequate for intercourse approximately 50% of the time without medication. He does have a prescription but has not yet tried sildenafil.     ALLERGIES: No Known Drug Allergies    MEDICATIONS: Fish Oil  Multivitamin  Sildenafil Citrate 20 mg tablet 1 tablet PO prn Pt. to take 1-5tabs po 1hr prn prior to sexual activity     GU PSH: Prostate Needle Biopsy - 09/24/2021     NON-GU PSH: Surgical Pathology, Gross And Microscopic Examination For Prostate Needle - 09/24/2021     GU PMH: Prostate Cancer - 10/15/2021, - 10/04/2021 ED due to arterial insufficiency - 10/04/2021, - 08/20/2021 Primary hypogonadism - 10/04/2021, - 08/20/2021 Elevated PSA - 09/24/2021, - 08/20/2021 BPH w/o LUTS - 08/20/2021 Encounter for Prostate Cancer screening - 08/20/2021    NON-GU PMH: None   FAMILY HISTORY: 2 daughters - Other Kidney Failure - Grandfather   SOCIAL HISTORY: Marital Status: Married Preferred Language: English; Race: White Drinks 2 caffeinated drinks per day.    REVIEW OF SYSTEMS:    GU Review Male:   Patient denies frequent urination, hard to postpone urination,  burning/ pain with urination, get up at night to urinate, leakage of urine, stream starts and stops, trouble starting your stream, have to strain to urinate , erection problems, and penile pain.  Gastrointestinal (Upper):   Patient denies nausea, vomiting, and indigestion/ heartburn.  Gastrointestinal (Lower):   Patient  denies diarrhea and constipation.  Constitutional:   Patient denies fever, night sweats, weight loss, and fatigue.  Skin:   Patient denies skin rash/ lesion and itching.  Eyes:   Patient denies blurred vision and double vision.  Ears/ Nose/ Throat:   Patient denies sore throat and sinus problems.  Hematologic/Lymphatic:   Patient denies swollen glands and easy bruising.  Cardiovascular:   Patient denies leg swelling and chest pains.  Respiratory:   Patient denies cough and shortness of breath.  Endocrine:   Patient denies excessive thirst.  Musculoskeletal:   Patient denies back pain and joint pain.  Neurological:   Patient denies headaches and dizziness.  Psychologic:   Patient denies depression and anxiety.   VITAL SIGNS:      11/19/2021 03:19 PM  Weight 209 lb / 94.8 kg  Height 74 in / 187.96 cm  BP 112/64 mmHg  Heart Rate 66 /min  Temperature 98.0 F / 36.6 C  BMI 26.8 kg/m   MULTI-SYSTEM PHYSICAL EXAMINATION:    Constitutional: Well-nourished. No physical deformities. Normally developed. Good grooming.  Neck: Neck symmetrical, not swollen. Normal tracheal position.  Respiratory: No labored breathing, no use of accessory muscles. Clear bilaterally.  Cardiovascular: Normal temperature, normal extremity pulses, no swelling, no varicosities. Regular rate and rhythm.  Skin: No paleness, no jaundice, no cyanosis. No lesion, no ulcer, no rash.  Neurologic / Psychiatric: Oriented to time, oriented to place, oriented to person. No depression, no anxiety, no agitation.  Gastrointestinal: No mass, no tenderness, no rigidity, non obese abdomen.  Musculoskeletal: Normal gait and station of head and neck.     Complexity of Data:  Source Of History:  Patient, Family/Caregiver, Medical Record Summary  Lab Test Review:   PSA, Total Testosterone  Records Review:   AUA Symptom Score, Pathology Reports, Previous Doctor Records, Previous Hospital Records, Previous Patient Records  Urine Test  Review:   Urinalysis   08/20/21  PSA  Total PSA 4.09 ng/mL    08/20/21  Hormones  Testosterone, Total 1500.0 ng/dL    PROCEDURES:          Urinalysis Dipstick Dipstick Cont'd  Color: Yellow Bilirubin: Neg mg/dL  Appearance: Clear Ketones: Neg mg/dL  Specific Gravity: 1.020 Blood: Neg ery/uL  pH: 6.0 Protein: Neg mg/dL  Glucose: Neg mg/dL Urobilinogen: 0.2 mg/dL    Nitrites: Neg    Leukocyte Esterase: Neg leu/uL    ASSESSMENT:      ICD-10 Details  1 GU:   Prostate Cancer - C61 Chronic, Threat to Bodily Function  2 NON-GU:   Encounter for other preprocedural examination - Z01.818 Undiagnosed New Problem   PLAN:           Orders Labs Urine Culture          Schedule Return Visit/Planned Activity: Keep Scheduled Appointment - Schedule Surgery             Note: Cancel f/u with Dr Gloriann Loan on 12/01, he is having surgery with Dr Alinda Money on the 8th.  Return Visit/Planned Activity: ASAP - PT Referral          Document Letter(s):  Created for Patient: Clinical Summary  Notes:   All questions answered to the best of my ability regarding the upcoming procedure and expected postoperative course with understanding expressed by the patient. Urine culture sent today to serve his baseline for his upcoming prostatectomy. He will keep previously scheduled surgery date on 12/8 with Dr. Alinda Money.   Role of pelvic floor physical therapy discussed in detail especially in regards to his postoperative recovery specifically management of expected stress urinary incontinence post procedure. I will make a referral for him to be seen ASAP with our pelvic floor physical therapy specialists. I am hopeful scheduling will work out for him to be able to meet with him prior to his surgery on 12/8.        Next Appointment:      Next Appointment: 11/28/2021 09:15 AM    Appointment Type: Office Visit Established Patient    Location: [facility] [phone] [id number]    Provider: Link Snuffer,  III, M.D.    Reason for Visit: 1 mo OV      * Signed by Jiles Crocker, NP on 11/19/21 at 3:37 PM (EST)*

## 2021-12-04 NOTE — Anesthesia Preprocedure Evaluation (Addendum)
Anesthesia Evaluation  Patient identified by MRN, date of birth, ID band Patient awake    Reviewed: Allergy & Precautions, H&P , NPO status , Patient's Chart, lab work & pertinent test results  Airway Mallampati: II  TM Distance: >3 FB Neck ROM: Full    Dental no notable dental hx.    Pulmonary neg pulmonary ROS,    Pulmonary exam normal breath sounds clear to auscultation       Cardiovascular hypertension, Normal cardiovascular exam Rhythm:Regular Rate:Normal     Neuro/Psych negative neurological ROS  negative psych ROS   GI/Hepatic negative GI ROS, Neg liver ROS,   Endo/Other  negative endocrine ROS  Renal/GU negative Renal ROS  negative genitourinary   Musculoskeletal negative musculoskeletal ROS (+)   Abdominal   Peds negative pediatric ROS (+)  Hematology negative hematology ROS (+)   Anesthesia Other Findings   Reproductive/Obstetrics negative OB ROS                            Anesthesia Physical Anesthesia Plan  ASA: 2  Anesthesia Plan: General   Post-op Pain Management: Ofirmev IV (intra-op)   Induction: Intravenous  PONV Risk Score and Plan: 2 and Ondansetron, Dexamethasone and Treatment may vary due to age or medical condition  Airway Management Planned: Oral ETT  Additional Equipment:   Intra-op Plan:   Post-operative Plan: Extubation in OR  Informed Consent: I have reviewed the patients History and Physical, chart, labs and discussed the procedure including the risks, benefits and alternatives for the proposed anesthesia with the patient or authorized representative who has indicated his/her understanding and acceptance.     Dental advisory given  Plan Discussed with: CRNA and Surgeon  Anesthesia Plan Comments:         Anesthesia Quick Evaluation

## 2021-12-05 ENCOUNTER — Ambulatory Visit (HOSPITAL_COMMUNITY): Payer: 59 | Admitting: Certified Registered Nurse Anesthetist

## 2021-12-05 ENCOUNTER — Encounter (HOSPITAL_COMMUNITY): Payer: Self-pay | Admitting: Urology

## 2021-12-05 ENCOUNTER — Observation Stay (HOSPITAL_COMMUNITY)
Admission: RE | Admit: 2021-12-05 | Discharge: 2021-12-06 | Disposition: A | Discharge status: Home / Self Care | Payer: 59 | Attending: Urology | Admitting: Urology

## 2021-12-05 ENCOUNTER — Encounter (HOSPITAL_COMMUNITY)
Admission: RE | Disposition: A | Discharge status: Home / Self Care | Payer: Self-pay | Source: Home / Self Care | Attending: Urology

## 2021-12-05 DIAGNOSIS — I10 Essential (primary) hypertension: Secondary | ICD-10-CM | POA: Diagnosis not present

## 2021-12-05 DIAGNOSIS — C61 Malignant neoplasm of prostate: Principal | ICD-10-CM | POA: Insufficient documentation

## 2021-12-05 DIAGNOSIS — Z79899 Other long term (current) drug therapy: Secondary | ICD-10-CM | POA: Insufficient documentation

## 2021-12-05 HISTORY — PX: LYMPHADENECTOMY: SHX5960

## 2021-12-05 HISTORY — PX: ROBOT ASSISTED LAPAROSCOPIC RADICAL PROSTATECTOMY: SHX5141

## 2021-12-05 LAB — HEMOGLOBIN AND HEMATOCRIT, BLOOD
HCT: 42.8 % (ref 39.0–52.0)
Hemoglobin: 15.2 g/dL (ref 13.0–17.0)

## 2021-12-05 LAB — TYPE AND SCREEN
ABO/RH(D): A NEG
Antibody Screen: NEGATIVE

## 2021-12-05 LAB — ABO/RH: ABO/RH(D): A NEG

## 2021-12-05 SURGERY — XI ROBOTIC ASSISTED LAPAROSCOPIC RADICAL PROSTATECTOMY LEVEL 2
Anesthesia: General

## 2021-12-05 MED ORDER — ROCURONIUM BROMIDE 10 MG/ML (PF) SYRINGE
PREFILLED_SYRINGE | INTRAVENOUS | Status: AC
  Filled 2021-12-05: qty 10

## 2021-12-05 MED ORDER — HEPARIN SODIUM (PORCINE) 1000 UNIT/ML IJ SOLN
INTRAMUSCULAR | Status: AC
  Filled 2021-12-05: qty 1

## 2021-12-05 MED ORDER — OXYCODONE HCL 5 MG PO TABS: 5.0000 mg | ORAL_TABLET | Freq: Once | ORAL | Status: AC | PRN

## 2021-12-05 MED ORDER — HYDROMORPHONE HCL 1 MG/ML IJ SOLN
0.2500 mg | INTRAMUSCULAR | Status: DC | PRN
  Administered 2021-12-05: 0.5 mg via INTRAVENOUS
  Administered 2021-12-05: 0.25 mg via INTRAVENOUS

## 2021-12-05 MED ORDER — CHLORHEXIDINE GLUCONATE CLOTH 2 % EX PADS
6.0000 | MEDICATED_PAD | Freq: Every day | CUTANEOUS | Status: DC
  Administered 2021-12-05: 6 via TOPICAL

## 2021-12-05 MED ORDER — SODIUM CHLORIDE 0.9 % IR SOLN
Status: DC | PRN
  Administered 2021-12-05: 1000 mL via INTRAVESICAL

## 2021-12-05 MED ORDER — TRAMADOL HCL 50 MG PO TABS: 50.0000 mg | ORAL_TABLET | Freq: Four times a day (QID) | ORAL | 0 refills | Status: DC | PRN

## 2021-12-05 MED ORDER — DEXAMETHASONE SODIUM PHOSPHATE 10 MG/ML IJ SOLN
INTRAMUSCULAR | Status: AC
  Filled 2021-12-05: qty 1

## 2021-12-05 MED ORDER — ROCURONIUM BROMIDE 10 MG/ML (PF) SYRINGE
PREFILLED_SYRINGE | INTRAVENOUS | Status: DC | PRN
  Administered 2021-12-05 (×2): 20 mg via INTRAVENOUS
  Administered 2021-12-05: 80 mg via INTRAVENOUS
  Administered 2021-12-05: 30 mg via INTRAVENOUS

## 2021-12-05 MED ORDER — LIDOCAINE 2% (20 MG/ML) 5 ML SYRINGE
INTRAMUSCULAR | Status: DC | PRN
  Administered 2021-12-05: 60 mg via INTRAVENOUS

## 2021-12-05 MED ORDER — ONDANSETRON HCL 4 MG/2ML IJ SOLN
INTRAMUSCULAR | Status: AC
  Filled 2021-12-05: qty 2

## 2021-12-05 MED ORDER — FLEET ENEMA 7-19 GM/118ML RE ENEM
1.0000 | ENEMA | Freq: Once | RECTAL | Status: DC
  Filled 2021-12-05: qty 1

## 2021-12-05 MED ORDER — ONDANSETRON HCL 4 MG/2ML IJ SOLN
4.0000 mg | INTRAMUSCULAR | Status: DC | PRN
  Administered 2021-12-05: 4 mg via INTRAVENOUS
  Filled 2021-12-05: qty 2

## 2021-12-05 MED ORDER — MORPHINE SULFATE (PF) 2 MG/ML IV SOLN
2.0000 mg | INTRAVENOUS | Status: DC | PRN
  Administered 2021-12-05 (×2): 2 mg via INTRAVENOUS
  Filled 2021-12-05 (×2): qty 1

## 2021-12-05 MED ORDER — LACTATED RINGERS IV SOLN: INTRAVENOUS | Status: DC

## 2021-12-05 MED ORDER — CHLORHEXIDINE GLUCONATE 0.12 % MT SOLN
15.0000 mL | Freq: Once | OROMUCOSAL | Status: AC
  Administered 2021-12-05: 15 mL via OROMUCOSAL

## 2021-12-05 MED ORDER — DOCUSATE SODIUM 100 MG PO CAPS
100.0000 mg | ORAL_CAPSULE | Freq: Two times a day (BID) | ORAL | Status: DC
  Administered 2021-12-05 – 2021-12-06 (×2): 100 mg via ORAL
  Filled 2021-12-05 (×2): qty 1

## 2021-12-05 MED ORDER — SUGAMMADEX SODIUM 200 MG/2ML IV SOLN
INTRAVENOUS | Status: DC | PRN
  Administered 2021-12-05: 200 mg via INTRAVENOUS

## 2021-12-05 MED ORDER — POLYETHYLENE GLYCOL 3350 17 G PO PACK: 17.0000 g | PACK | Freq: Every day | ORAL | Status: DC

## 2021-12-05 MED ORDER — ONDANSETRON HCL 4 MG/2ML IJ SOLN: 4.0000 mg | Freq: Once | INTRAMUSCULAR | Status: DC | PRN

## 2021-12-05 MED ORDER — KCL IN DEXTROSE-NACL 20-5-0.45 MEQ/L-%-% IV SOLN
INTRAVENOUS | Status: DC
  Filled 2021-12-05 (×3): qty 1000

## 2021-12-05 MED ORDER — BACITRACIN-NEOMYCIN-POLYMYXIN 400-5-5000 EX OINT: 1.0000 "application " | TOPICAL_OINTMENT | Freq: Three times a day (TID) | CUTANEOUS | Status: DC | PRN

## 2021-12-05 MED ORDER — MIDAZOLAM HCL 5 MG/5ML IJ SOLN
INTRAMUSCULAR | Status: DC | PRN
  Administered 2021-12-05: 2 mg via INTRAVENOUS

## 2021-12-05 MED ORDER — KETOROLAC TROMETHAMINE 15 MG/ML IJ SOLN
15.0000 mg | Freq: Four times a day (QID) | INTRAMUSCULAR | Status: DC
  Administered 2021-12-05 – 2021-12-06 (×3): 15 mg via INTRAVENOUS
  Filled 2021-12-05 (×3): qty 1

## 2021-12-05 MED ORDER — DEXAMETHASONE SODIUM PHOSPHATE 4 MG/ML IJ SOLN
INTRAMUSCULAR | Status: DC | PRN
  Administered 2021-12-05: 10 mg via INTRAVENOUS

## 2021-12-05 MED ORDER — HYDROMORPHONE HCL 1 MG/ML IJ SOLN
INTRAMUSCULAR | Status: DC | PRN
  Administered 2021-12-05 (×2): .5 mg via INTRAVENOUS
  Administered 2021-12-05: 1 mg via INTRAVENOUS

## 2021-12-05 MED ORDER — KETOROLAC TROMETHAMINE 30 MG/ML IJ SOLN
INTRAMUSCULAR | Status: AC
  Filled 2021-12-05: qty 1

## 2021-12-05 MED ORDER — MIDAZOLAM HCL 2 MG/2ML IJ SOLN
INTRAMUSCULAR | Status: AC
  Filled 2021-12-05: qty 2

## 2021-12-05 MED ORDER — STERILE WATER FOR IRRIGATION IR SOLN
Status: DC | PRN
  Administered 2021-12-05: 1000 mL

## 2021-12-05 MED ORDER — OXYCODONE HCL 5 MG/5ML PO SOLN: 5.0000 mg | Freq: Once | ORAL | Status: AC | PRN

## 2021-12-05 MED ORDER — KETOROLAC TROMETHAMINE 30 MG/ML IJ SOLN
30.0000 mg | Freq: Once | INTRAMUSCULAR | Status: AC | PRN
  Administered 2021-12-05: 30 mg via INTRAVENOUS

## 2021-12-05 MED ORDER — DIPHENHYDRAMINE HCL 50 MG/ML IJ SOLN: 12.5000 mg | Freq: Four times a day (QID) | INTRAMUSCULAR | Status: DC | PRN

## 2021-12-05 MED ORDER — PROPOFOL 10 MG/ML IV BOLUS
INTRAVENOUS | Status: AC
  Filled 2021-12-05: qty 20

## 2021-12-05 MED ORDER — HYDROMORPHONE HCL 1 MG/ML IJ SOLN
INTRAMUSCULAR | Status: AC
  Administered 2021-12-05: 0.25 mg via INTRAVENOUS
  Filled 2021-12-05: qty 1

## 2021-12-05 MED ORDER — ZOLPIDEM TARTRATE 5 MG PO TABS: 5.0000 mg | ORAL_TABLET | Freq: Every evening | ORAL | Status: DC | PRN

## 2021-12-05 MED ORDER — ONDANSETRON HCL 4 MG/2ML IJ SOLN
INTRAMUSCULAR | Status: DC | PRN
  Administered 2021-12-05: 4 mg via INTRAVENOUS

## 2021-12-05 MED ORDER — FENTANYL CITRATE (PF) 250 MCG/5ML IJ SOLN
INTRAMUSCULAR | Status: AC
  Filled 2021-12-05: qty 5

## 2021-12-05 MED ORDER — FENTANYL CITRATE (PF) 100 MCG/2ML IJ SOLN
INTRAMUSCULAR | Status: DC | PRN
  Administered 2021-12-05: 100 ug via INTRAVENOUS
  Administered 2021-12-05: 50 ug via INTRAVENOUS
  Administered 2021-12-05: 100 ug via INTRAVENOUS

## 2021-12-05 MED ORDER — ORAL CARE MOUTH RINSE: 15.0000 mL | Freq: Once | OROMUCOSAL | Status: AC

## 2021-12-05 MED ORDER — ACETAMINOPHEN 10 MG/ML IV SOLN
INTRAVENOUS | Status: AC
  Filled 2021-12-05: qty 100

## 2021-12-05 MED ORDER — SODIUM CHLORIDE 0.9 % IV BOLUS
1000.0000 mL | Freq: Once | INTRAVENOUS | Status: AC
  Administered 2021-12-05: 1000 mL via INTRAVENOUS

## 2021-12-05 MED ORDER — DOCUSATE SODIUM 100 MG PO CAPS: 100.0000 mg | ORAL_CAPSULE | Freq: Two times a day (BID) | ORAL | Status: DC

## 2021-12-05 MED ORDER — BELLADONNA ALKALOIDS-OPIUM 16.2-60 MG RE SUPP: 1.0000 | Freq: Four times a day (QID) | RECTAL | Status: DC | PRN

## 2021-12-05 MED ORDER — BUPIVACAINE-EPINEPHRINE 0.25% -1:200000 IJ SOLN
INTRAMUSCULAR | Status: AC
  Filled 2021-12-05: qty 1

## 2021-12-05 MED ORDER — BUPIVACAINE-EPINEPHRINE 0.25% -1:200000 IJ SOLN
INTRAMUSCULAR | Status: DC | PRN
  Administered 2021-12-05: 20 mL

## 2021-12-05 MED ORDER — CEFAZOLIN SODIUM-DEXTROSE 1-4 GM/50ML-% IV SOLN
1.0000 g | Freq: Three times a day (TID) | INTRAVENOUS | Status: AC
  Administered 2021-12-05 – 2021-12-06 (×2): 1 g via INTRAVENOUS
  Filled 2021-12-05 (×2): qty 50

## 2021-12-05 MED ORDER — ACETAMINOPHEN 10 MG/ML IV SOLN
INTRAVENOUS | Status: DC | PRN
  Administered 2021-12-05: 1000 mg via INTRAVENOUS

## 2021-12-05 MED ORDER — LIDOCAINE HCL (PF) 2 % IJ SOLN
INTRAMUSCULAR | Status: AC
  Filled 2021-12-05: qty 5

## 2021-12-05 MED ORDER — SULFAMETHOXAZOLE-TRIMETHOPRIM 800-160 MG PO TABS: 1.0000 | ORAL_TABLET | Freq: Two times a day (BID) | ORAL | 0 refills | Status: DC

## 2021-12-05 MED ORDER — OXYCODONE HCL 5 MG PO TABS
ORAL_TABLET | ORAL | Status: AC
  Administered 2021-12-05: 5 mg via ORAL
  Filled 2021-12-05: qty 1

## 2021-12-05 MED ORDER — ACETAMINOPHEN 325 MG PO TABS: 650.0000 mg | ORAL_TABLET | ORAL | Status: DC | PRN

## 2021-12-05 MED ORDER — PROPOFOL 10 MG/ML IV BOLUS
INTRAVENOUS | Status: DC | PRN
  Administered 2021-12-05: 150 mg via INTRAVENOUS

## 2021-12-05 MED ORDER — LACTATED RINGERS IV SOLN
INTRAVENOUS | Status: DC | PRN
  Administered 2021-12-05: 1000 mL

## 2021-12-05 MED ORDER — DIPHENHYDRAMINE HCL 12.5 MG/5ML PO ELIX: 12.5000 mg | ORAL_SOLUTION | Freq: Four times a day (QID) | ORAL | Status: DC | PRN

## 2021-12-05 MED ORDER — PHENYLEPHRINE HCL-NACL 20-0.9 MG/250ML-% IV SOLN
INTRAVENOUS | Status: AC
  Filled 2021-12-05: qty 250

## 2021-12-05 MED ORDER — CEFAZOLIN SODIUM-DEXTROSE 2-4 GM/100ML-% IV SOLN
2.0000 g | Freq: Once | INTRAVENOUS | Status: AC
  Administered 2021-12-05: 2 g via INTRAVENOUS
  Filled 2021-12-05: qty 100

## 2021-12-05 MED ORDER — HYDROMORPHONE HCL 2 MG/ML IJ SOLN
INTRAMUSCULAR | Status: AC
  Filled 2021-12-05: qty 1

## 2021-12-05 SURGICAL SUPPLY — 67 items
ADH SKN CLS APL DERMABOND .7 (GAUZE/BANDAGES/DRESSINGS) ×2
APL PRP STRL LF DISP 70% ISPRP (MISCELLANEOUS) ×2
APL SWBSTK 6 STRL LF DISP (MISCELLANEOUS) ×2
APPLICATOR COTTON TIP 6 STRL (MISCELLANEOUS) ×2 IMPLANT
APPLICATOR COTTON TIP 6IN STRL (MISCELLANEOUS) ×3
BAG COUNTER SPONGE SURGICOUNT (BAG) IMPLANT
BAG SPNG CNTER NS LX DISP (BAG)
CATH FOLEY 2WAY SLVR 18FR 30CC (CATHETERS) ×3 IMPLANT
CATH ROBINSON RED A/P 16FR (CATHETERS) ×3 IMPLANT
CATH ROBINSON RED A/P 8FR (CATHETERS) ×3 IMPLANT
CATH TIEMANN FOLEY 18FR 5CC (CATHETERS) ×3 IMPLANT
CHLORAPREP W/TINT 26 (MISCELLANEOUS) ×3 IMPLANT
CLIP LIGATING HEM O LOK PURPLE (MISCELLANEOUS) ×12 IMPLANT
COVER SURGICAL LIGHT HANDLE (MISCELLANEOUS) ×3 IMPLANT
COVER TIP SHEARS 8 DVNC (MISCELLANEOUS) ×2 IMPLANT
COVER TIP SHEARS 8MM DA VINCI (MISCELLANEOUS) ×3
CUTTER ECHEON FLEX ENDO 45 340 (ENDOMECHANICALS) ×3 IMPLANT
DECANTER SPIKE VIAL GLASS SM (MISCELLANEOUS) ×3 IMPLANT
DERMABOND ADVANCED (GAUZE/BANDAGES/DRESSINGS) ×1
DERMABOND ADVANCED .7 DNX12 (GAUZE/BANDAGES/DRESSINGS) ×2 IMPLANT
DRAIN CHANNEL RND F F (WOUND CARE) IMPLANT
DRAPE ARM DVNC X/XI (DISPOSABLE) ×8 IMPLANT
DRAPE COLUMN DVNC XI (DISPOSABLE) ×2 IMPLANT
DRAPE DA VINCI XI ARM (DISPOSABLE) ×12
DRAPE DA VINCI XI COLUMN (DISPOSABLE) ×3
DRAPE SURG IRRIG POUCH 19X23 (DRAPES) ×3 IMPLANT
DRSG TEGADERM 4X4.75 (GAUZE/BANDAGES/DRESSINGS) ×3 IMPLANT
ELECT PENCIL ROCKER SW 15FT (MISCELLANEOUS) ×3 IMPLANT
ELECT REM PT RETURN 15FT ADLT (MISCELLANEOUS) ×3 IMPLANT
GAUZE 4X4 16PLY ~~LOC~~+RFID DBL (SPONGE) ×3 IMPLANT
GAUZE SPONGE 4X4 12PLY STRL (GAUZE/BANDAGES/DRESSINGS) ×3 IMPLANT
GLOVE SURG ENC MOIS LTX SZ6.5 (GLOVE) ×3 IMPLANT
GLOVE SURG ENC TEXT LTX SZ7.5 (GLOVE) ×6 IMPLANT
GOWN STRL REUS W/TWL LRG LVL3 (GOWN DISPOSABLE) ×9 IMPLANT
HOLDER FOLEY CATH W/STRAP (MISCELLANEOUS) ×3 IMPLANT
IRRIG SUCT STRYKERFLOW 2 WTIP (MISCELLANEOUS) ×3
IRRIGATION SUCT STRKRFLW 2 WTP (MISCELLANEOUS) ×2 IMPLANT
IV LACTATED RINGERS 1000ML (IV SOLUTION) ×3 IMPLANT
KIT TURNOVER KIT A (KITS) IMPLANT
NDL SAFETY ECLIPSE 18X1.5 (NEEDLE) ×2 IMPLANT
NEEDLE HYPO 18GX1.5 SHARP (NEEDLE) ×3
PACK ROBOT UROLOGY CUSTOM (CUSTOM PROCEDURE TRAY) ×3 IMPLANT
PENCIL SMOKE EVACUATOR (MISCELLANEOUS) IMPLANT
RELOAD STAPLE 45 4.1 GRN THCK (STAPLE) ×2 IMPLANT
SEAL CANN UNIV 5-8 DVNC XI (MISCELLANEOUS) ×8 IMPLANT
SEAL XI 5MM-8MM UNIVERSAL (MISCELLANEOUS) ×12
SET TUBE SMOKE EVAC HIGH FLOW (TUBING) ×3 IMPLANT
SOLUTION ELECTROLUBE (MISCELLANEOUS) ×3 IMPLANT
STAPLE RELOAD 45 GRN (STAPLE) ×2 IMPLANT
STAPLE RELOAD 45MM GREEN (STAPLE) ×3
SUT ETHILON 3 0 PS 1 (SUTURE) ×3 IMPLANT
SUT MNCRL 3 0 RB1 (SUTURE) ×2 IMPLANT
SUT MNCRL 3 0 VIOLET RB1 (SUTURE) ×2 IMPLANT
SUT MNCRL AB 4-0 PS2 18 (SUTURE) ×6 IMPLANT
SUT MONOCRYL 3 0 RB1 (SUTURE) ×6
SUT PDS PLUS 0 (SUTURE) ×6
SUT PDS PLUS AB 0 CT-2 (SUTURE) ×4 IMPLANT
SUT VIC AB 0 CT1 27 (SUTURE) ×3
SUT VIC AB 0 CT1 27XBRD ANTBC (SUTURE) ×2 IMPLANT
SUT VIC AB 0 UR5 27 (SUTURE) ×3 IMPLANT
SUT VIC AB 2-0 SH 27 (SUTURE) ×3
SUT VIC AB 2-0 SH 27X BRD (SUTURE) ×2 IMPLANT
SYR 27GX1/2 1ML LL SAFETY (SYRINGE) ×3 IMPLANT
TOWEL OR NON WOVEN STRL DISP B (DISPOSABLE) ×3 IMPLANT
TROCAR XCEL 12X100 BLDLESS (ENDOMECHANICALS) ×1 IMPLANT
TROCAR XCEL NON-BLD 5MMX100MML (ENDOMECHANICALS) ×1 IMPLANT
WATER STERILE IRR 1000ML POUR (IV SOLUTION) ×3 IMPLANT

## 2021-12-05 NOTE — Anesthesia Postprocedure Evaluation (Signed)
Anesthesia Post Note  Patient: Tanner Hughes  Procedure(s) Performed: XI ROBOTIC ASSISTED LAPAROSCOPIC RADICAL PROSTATECTOMY LEVEL 2 LYMPHADENECTOMY, PELVIC (Bilateral)     Patient location during evaluation: PACU Anesthesia Type: General Level of consciousness: awake and alert Pain management: pain level controlled Vital Signs Assessment: post-procedure vital signs reviewed and stable Respiratory status: spontaneous breathing, nonlabored ventilation, respiratory function stable and patient connected to nasal cannula oxygen Cardiovascular status: blood pressure returned to baseline and stable Postop Assessment: no apparent nausea or vomiting Anesthetic complications: no   No notable events documented.  Last Vitals:  Vitals:   12/05/21 1030 12/05/21 1045  BP: (!) 150/93 (!) 157/96  Pulse: 71 75  Resp: 11 11  Temp:    SpO2: 100% 91%    Last Pain:  Vitals:   12/05/21 1007  TempSrc:   PainSc: 0-No pain                 Cristina Mattern S

## 2021-12-05 NOTE — Progress Notes (Signed)
Patient ID: Tanner Hughes, male   DOB: 1961-02-08, 60 y.o.   MRN: [id number]  Post-op note  Subjective: The patient is doing well.  Complains of bladder spasms.  Objective: Vital signs in last 24 hours: Temp:  [97.8 F (36.6 C)-98.2 F (36.8 C)] 97.8 F (36.6 C) (12/08 1338) Pulse Rate:  [66-76] 71 (12/08 1338) Resp:  [8-18] 17 (12/08 1338) BP: (130-159)/(80-98) 144/87 (12/08 1338) SpO2:  [91 %-100 %] 99 % (12/08 1338) Weight:  [96.6 kg] 96.6 kg (12/08 0619)  Intake/Output from previous day: No intake/output data recorded. Intake/Output this shift: Total I/O In: 2600 [I.V.:1400; IV Piggyback:1200] Out: 750 [Urine:600; Drains:50; Blood:100]  Physical Exam:  General: Alert and oriented. Abdomen: Soft, Nondistended. Incisions: Clean and dry. GU: Urine clear.  Lab Results: Recent Labs    12/05/21 1027  HGB 15.2  HCT 42.8    Assessment/Plan: POD#0   1) Continue to monitor, ambulate, IS   Tanner Hughes. MD   LOS: 0 days   Dutch Gray 12/05/2021, 2:49 PM

## 2021-12-05 NOTE — Interval H&P Note (Signed)
History and Physical Interval Note:  12/05/2021 7:19 AM  Tanner Hughes  has presented today for surgery, with the diagnosis of PROSTATE CANCER.  The various methods of treatment have been discussed with the patient and family. After consideration of risks, benefits and other options for treatment, the patient has consented to  Procedure(s): XI ROBOTIC ASSISTED LAPAROSCOPIC RADICAL PROSTATECTOMY LEVEL 2 (N/A) LYMPHADENECTOMY, PELVIC (Bilateral) as a surgical intervention.  The patient's history has been reviewed, patient examined, no change in status, stable for surgery.  I have reviewed the patient's chart and labs.  Questions were answered to the patient's satisfaction.     [employer]

## 2021-12-05 NOTE — Discharge Instructions (Signed)

## 2021-12-05 NOTE — Op Note (Signed)
Preoperative diagnosis: Clinically localized adenocarcinoma of the prostate (clinical stage T1c Nx Mx)  Postoperative diagnosis: Clinically localized adenocarcinoma of the prostate (clinical stage T1c Nx Mx)  Procedure:  Robotic assisted laparoscopic radical prostatectomy (bilateral nerve sparing) Bilateral robotic assisted laparoscopic pelvic lymphadenectomy  Surgeon: Pryor Curia. M.D.  Assistant: Debbrah Alar, PA-C  An assistant was required for this surgical procedure.  The duties of the assistant included but were not limited to suctioning, passing suture, camera manipulation, retraction. This procedure would not be able to be performed without an Environmental consultant.  Resident: Dr. Aldine Contes  Anesthesia: General  Complications: None  EBL: 100 mL  IVF:  2000 mL crystalloid  Specimens: Prostate and seminal vesicles Right pelvic lymph nodes Left pelvic lymph nodes  Disposition of specimens: Pathology  Drains: 20 Fr coude catheter # 34 Blake pelvic drain  Indication: Tanner Hughes is a 60 y.o. year old patient with clinically localized prostate cancer.  After a thorough review of the management options for treatment of prostate cancer, he elected to proceed with surgical therapy and the above procedure(s).  We have discussed the potential benefits and risks of the procedure, side effects of the proposed treatment, the likelihood of the patient achieving the goals of the procedure, and any potential problems that might occur during the procedure or recuperation. Informed consent has been obtained.  Description of procedure:  The patient was taken to the operating room and a general anesthetic was administered. He was given preoperative antibiotics, placed in the dorsal lithotomy position, and prepped and draped in the usual sterile fashion. Next a preoperative timeout was performed. A urethral catheter was placed into the bladder and a site was selected near the umbilicus for  placement of the camera port. This was placed using a standard open Hassan technique which allowed entry into the peritoneal cavity under direct vision and without difficulty. An 8 mm robotic port was placed and a pneumoperitoneum established. The camera was then used to inspect the abdomen and there was no evidence of any intra-abdominal injuries or other abnormalities. The remaining abdominal ports were then placed. 8 mm robotic ports were placed in the right lower quadrant, left lower quadrant, and far left lateral abdominal wall. A 5 mm port was placed in the right upper quadrant and a 12 mm port was placed in the right lateral abdominal wall for laparoscopic assistance. All ports were placed under direct vision without difficulty. The surgical cart was then docked.   Utilizing the cautery scissors, the bladder was reflected posteriorly allowing entry into the space of Retzius and identification of the endopelvic fascia and prostate. The periprostatic fat was then removed from the prostate allowing full exposure of the endopelvic fascia. The endopelvic fascia was then incised from the apex back to the base of the prostate bilaterally and the underlying levator muscle fibers were swept laterally off the prostate thereby isolating the dorsal venous complex. The dorsal vein was then stapled and divided with a 45 mm Flex Echelon stapler. Attention then turned to the bladder neck which was divided anteriorly thereby allowing entry into the bladder and exposure of the urethral catheter. The catheter balloon was deflated and the catheter was brought into the operative field and used to retract the prostate anteriorly. The posterior bladder neck was then examined and was divided allowing further dissection between the bladder and prostate posteriorly until the vasa deferentia and seminal vessels were identified. The vasa deferentia were isolated, divided, and lifted anteriorly. The seminal  vesicles were dissected down  to their tips with care to control the seminal vascular arterial blood supply. These structures were then lifted anteriorly and the space between Denonvillier's fascia and the anterior rectum was developed with a combination of sharp and blunt dissection. This isolated the vascular pedicles of the prostate.  The lateral prostatic fascia was then sharply incised allowing release of the neurovascular bundles bilaterally. The vascular pedicles of the prostate were then ligated with Weck clips between the prostate and neurovascular bundles and divided with sharp cold scissor dissection resulting in neurovascular bundle preservation. The neurovascular bundles were then separated off the apex of the prostate and urethra bilaterally.  The urethra was then sharply transected allowing the prostate specimen to be disarticulated. The pelvis was copiously irrigated and hemostasis was ensured. There was no evidence for rectal injury.  Attention then turned to the right pelvic sidewall. The fibrofatty tissue between the external iliac vein, confluence of the iliac vessels, hypogastric artery, and Cooper's ligament was dissected free from the pelvic sidewall with care to preserve the obturator nerve. Weck clips were used for lymphostasis and hemostasis. An identical procedure was performed on the contralateral side and the lymphatic packets were removed for permanent pathologic analysis.  Attention then turned to the urethral anastomosis. A 2-0 Vicryl slip knot was placed between Denonvillier's fascia, the posterior bladder neck, and the posterior urethra to reapproximate these structures. A double-armed 3-0 Monocryl suture was then used to perform a 360 running tension-free anastomosis between the bladder neck and urethra. A new urethral catheter was then placed into the bladder and irrigated. There were no blood clots within the bladder and the anastomosis appeared to be watertight. A #19 Blake drain was then brought  through the left lateral 8 mm port site and positioned appropriately within the pelvis. It was secured to the skin with a nylon suture. The surgical cart was then undocked. The right lateral 12 mm port site was closed at the fascial level with a 0 Vicryl suture placed laparoscopically. All remaining ports were then removed under direct vision. The prostate specimen was removed intact within the Endopouch retrieval bag via the periumbilical camera port site. This fascial opening was closed with two running 0 PDS sutures. 0.25% Marcaine was then injected into all port sites and all incisions were reapproximated at the skin level with 4-0 Monocryl subcuticular sutures and Dermabond. The patient appeared to tolerate the procedure well and without complications. The patient was able to be extubated and transferred to the recovery unit in satisfactory condition.   Pryor Curia MD

## 2021-12-05 NOTE — Anesthesia Procedure Notes (Signed)
Procedure Name: Intubation Date/Time: 12/05/2021 7:28 AM Performed by: Claudia Desanctis, CRNA Pre-anesthesia Checklist: Patient identified, Emergency Drugs available, Suction available and Patient being monitored Patient Re-evaluated:Patient Re-evaluated prior to induction Oxygen Delivery Method: Circle system utilized Preoxygenation: Pre-oxygenation with 100% oxygen Induction Type: IV induction Ventilation: Mask ventilation without difficulty Laryngoscope Size: 2 and Miller Grade View: Grade I Tube type: Oral Tube size: 7.5 mm Number of attempts: 1 Airway Equipment and Method: Stylet Placement Confirmation: ETT inserted through vocal cords under direct vision, positive ETCO2 and breath sounds checked- equal and bilateral Secured at: 22 cm Tube secured with: Tape Dental Injury: Teeth and Oropharynx as per pre-operative assessment

## 2021-12-05 NOTE — Transfer of Care (Signed)
Immediate Anesthesia Transfer of Care Note  Patient: Tanner Hughes  Procedure(s) Performed: XI ROBOTIC ASSISTED LAPAROSCOPIC RADICAL PROSTATECTOMY LEVEL 2 LYMPHADENECTOMY, PELVIC (Bilateral)  Patient Location: PACU  Anesthesia Type:General  Level of Consciousness: awake and patient cooperative  Airway & Oxygen Therapy: Patient Spontanous Breathing and Patient connected to nasal cannula oxygen  Post-op Assessment: Report given to RN and Post -op Vital signs reviewed and stable  Post vital signs: Reviewed and stable  Last Vitals:  Vitals Value Taken Time  BP    Temp    Pulse    Resp    SpO2      Last Pain:  Vitals:   12/05/21 0619  TempSrc:   PainSc: 0-No pain         Complications: No notable events documented.

## 2021-12-05 NOTE — Progress Notes (Signed)
The patient ambulated 180 feet, Activity tolerated well. Gait steady.

## 2021-12-05 NOTE — Plan of Care (Signed)
Patient will call for assistance when needing to get out of the bed. Call bell and personal belongings were placed within reach. Pt rights and education provided. Pt verbalized understanding.

## 2021-12-06 ENCOUNTER — Encounter (HOSPITAL_COMMUNITY): Payer: Self-pay | Admitting: Urology

## 2021-12-06 DIAGNOSIS — C61 Malignant neoplasm of prostate: Secondary | ICD-10-CM | POA: Diagnosis not present

## 2021-12-06 LAB — HEMOGLOBIN AND HEMATOCRIT, BLOOD
HCT: 36.2 % — ABNORMAL LOW (ref 39.0–52.0)
Hemoglobin: 13 g/dL (ref 13.0–17.0)

## 2021-12-06 MED ORDER — BISACODYL 10 MG RE SUPP
10.0000 mg | Freq: Once | RECTAL | Status: AC
  Administered 2021-12-06: 10 mg via RECTAL
  Filled 2021-12-06: qty 1

## 2021-12-06 MED ORDER — TRAMADOL HCL 50 MG PO TABS
50.0000 mg | ORAL_TABLET | Freq: Four times a day (QID) | ORAL | Status: DC | PRN
Start: 2021-12-06 — End: 2021-12-06
  Administered 2021-12-06: 50 mg via ORAL
  Filled 2021-12-06: qty 1

## 2021-12-06 NOTE — Discharge Summary (Signed)
Date of admission: 12/05/2021  Date of discharge: 12/06/2021  Admission diagnosis: Prostate cancer   Discharge diagnosis: Prostate cancer   Secondary diagnoses:  Patient Active Problem List   Diagnosis Date Noted   Prostate cancer ([place]) 12/05/2021   Malignant neoplasm of prostate ([place]) 10/08/2021   Elevated PSA 07/05/2021   Personal history of colonic polyps - adenoma 03/22/2012   HYPERTENSION 08/16/2007    Procedures performed: Procedure(s): XI ROBOTIC ASSISTED LAPAROSCOPIC RADICAL PROSTATECTOMY LEVEL 2 LYMPHADENECTOMY, PELVIC  History and Physical: For full details, please see admission history and physical. Briefly, Tanner Hughes is a 60 y.o. year old patient with history of clinically localized prostate cancer who presented for robotic assisted laparoscopic radical prostatectomy with lymph node dissection.   Hospital Course: Patient tolerated the procedure well.  He was then transferred to the floor after an uneventful PACU stay.  His hospital course was uncomplicated. By the afternoon of POD0 he had already ambulated, was tolerating clears, and pain was well controlled.  His JP drain had minimal output so this was removed prior to discharge. On POD#1 he had met discharge criteria: was eating a regular diet, was up and ambulating independently,  pain was well controlled, catheter was draining well, and was ready to for discharge. He has follow up scheduled for 12/14 for catheter removal.    Laboratory values:  Recent Labs    12/05/21 1027 12/06/21 0540  HGB 15.2 13.0  HCT 42.8 36.2*   No results for input(s): NA, K, CL, CO2, GLUCOSE, BUN, CREATININE, CALCIUM in the last 72 hours. No results for input(s): LABPT, INR in the last 72 hours. No results for input(s): LABURIN in the last 72 hours. Results for orders placed or performed in visit on 12/03/21  SARS Coronavirus 2 (TAT 6-24 hrs)     Status: None   Collection Time: 12/03/21 12:00 AM  Result Value Ref Range Status   SARS  Coronavirus 2 RESULT: NEGATIVE  Final    Comment: RESULT: NEGATIVESARS-CoV-2 INTERPRETATION:A NEGATIVE  test result means that SARS-CoV-2 RNA was not present in the specimen above the limit of detection of this test. This does not preclude a possible SARS-CoV-2 infection and should not be used as the  sole basis for patient management decisions. Negative results must be combined with clinical observations, patient history, and epidemiological information. Optimum specimen types and timing for peak viral levels during infections caused by SARS-CoV-2  have not been determined. Collection of multiple specimens or types of specimens may be necessary to detect virus. Improper specimen collection and handling, sequence variability under primers/probes, or organism present below the limit of detection may  lead to false negative results. Positive and negative predictive values of testing are highly dependent on prevalence. False negative test results are more likely when prevalence of disease is high.The expected result is NEGATIVE.Fact S heet for  Healthcare Providers: LocalChronicle.no Sheet for Patients: SalonLookup.es Reference Range - Negative     Disposition: Home  Discharge instruction: The patient was instructed to be ambulatory but told to refrain from heavy lifting, strenuous activity, or driving.   Discharge medications:  Allergies as of 12/06/2021   No Known Allergies      Medication List     STOP taking these medications    multivitamin with minerals Tabs tablet   Omega-3 1000 MG Caps   Testosterone Cypionate 200 MG/ML Soln   VITAMIN E PO       TAKE these medications    acetaminophen 500 MG tablet Commonly  known as: TYLENOL Take 500-1,000 mg by mouth every 6 (six) hours as needed (for pain.).   docusate sodium 100 MG capsule Commonly known as: COLACE Take 1 capsule (100 mg total) by mouth 2 (two) times  daily.   sulfamethoxazole-trimethoprim 800-160 MG tablet Commonly known as: BACTRIM DS Take 1 tablet by mouth 2 (two) times daily. Start the day prior to foley removal appointment   traMADol 50 MG tablet Commonly known as: Ultram Take 1-2 tablets (50-100 mg total) by mouth every 6 (six) hours as needed for moderate pain or severe pain.        Followup:   Follow-up Information     Raynelle Bring, MD Follow up on 12/11/2021.   Specialty: Urology Why: at 12:45 Contact information: [address] [address] [phone]

## 2021-12-06 NOTE — Progress Notes (Signed)
Pt ambulated on hall way before bedtime and this morning,tolerated well,

## 2021-12-06 NOTE — Progress Notes (Signed)
Patient ID: Tanner Hughes, male   DOB: June 27, 1961, 60 y.o.   MRN: [id number]  1 Day Post-Op Subjective: The patient is doing well.  No nausea or vomiting. Pain is adequately controlled.  Objective: Vital signs in last 24 hours: Temp:  [97.8 F (36.6 C)-98.7 F (37.1 C)] 98.7 F (37.1 C) (12/09 0529) Pulse Rate:  [60-76] 60 (12/09 0529) Resp:  [8-20] 20 (12/09 0529) BP: (113-159)/(64-98) 117/66 (12/09 0529) SpO2:  [91 %-100 %] 96 % (12/09 0529)  Intake/Output from previous day: 12/08 0701 - 12/09 0700 In: 4648.7 [P.O.:240; I.V.:3158.7; IV Piggyback:1250] Out: 3810 [Urine:3600; Drains:110; Blood:100] Intake/Output this shift: No intake/output data recorded.  Physical Exam:  General: Alert and oriented. CV: RRR Lungs: Clear bilaterally. GI: Soft, Nondistended. Incisions: Clean, dry, and intact Urine: Clear Extremities: Nontender, no erythema, no edema.  Lab Results: Recent Labs    12/05/21 1027 12/06/21 0540  HGB 15.2 13.0  HCT 42.8 36.2*      Assessment/Plan: POD# 1 s/p robotic prostatectomy.  1) SL IVF 2) Ambulate, Incentive spirometry 3) Transition to oral pain medication 4) Dulcolax suppository 5) D/C pelvic drain 6) Plan for likely discharge later today   Tanner Hughes. MD   LOS: 0 days   Tanner Hughes 12/06/2021, 7:05 AM

## 2021-12-06 NOTE — Care Management (Signed)
  Transition of Care [facility]) Screening Note   Patient Details  Name: Tanner Hughes Date of Birth: [birth year]   Transition of Care [facility]) CM/SW Contact:    Dessa Phi, RN Phone Number: 12/06/2021, 10:01 AM    Transition of Care Department [facility]) has reviewed patient and no TOC needs have been identified at this time. We will continue to monitor patient advancement through interdisciplinary progression rounds. If new patient transition needs arise, please place a TOC consult.

## 2021-12-12 ENCOUNTER — Other Ambulatory Visit: Payer: Self-pay

## 2021-12-12 ENCOUNTER — Ambulatory Visit: Payer: 59 | Attending: Family Medicine | Admitting: Physical Therapy

## 2021-12-12 ENCOUNTER — Encounter: Payer: Self-pay | Admitting: Physical Therapy

## 2021-12-12 DIAGNOSIS — M62838 Other muscle spasm: Secondary | ICD-10-CM | POA: Insufficient documentation

## 2021-12-12 DIAGNOSIS — M6281 Muscle weakness (generalized): Secondary | ICD-10-CM | POA: Diagnosis not present

## 2021-12-12 LAB — SURGICAL PATHOLOGY

## 2021-12-12 NOTE — Therapy (Signed)
[facility] @ [place], [place], [address] Phone: [phone]   Fax:  [phone]  Physical Therapy Evaluation  Patient Details  Name: Tanner Hughes MRN: [id number] Date of Birth: 01-Jul-1961 Referring Provider (PT): Raynelle Bring, MD   Encounter Date: 12/12/2021   PT End of Session - 12/12/21 1638     Visit Number 1    Date for PT Re-Evaluation 04/03/22    Authorization Type [employer]    PT Start Time 6759    PT Stop Time 1440    PT Time Calculation (min) 37 min    Activity Tolerance Patient tolerated treatment well    Behavior During Therapy [facility] for tasks assessed/performed             Past Medical History:  Diagnosis Date   Hypertension    Personal history of colonic polyps - adenoma 03/22/2012   Diminutive adenoma removed from cecum 02/2012 Carlean Purl) - anticipate routine repeat colonoscopy  approx 02/2017     Past Surgical History:  Procedure Laterality Date   COLONOSCOPY  05/19/2017   per Dr. Carlean Purl, adenomas, repeat in 5 yrs    LYMPHADENECTOMY Bilateral 12/05/2021   Procedure: LYMPHADENECTOMY, PELVIC;  Surgeon: Raynelle Bring, MD;  Location: [facility];  Service: Urology;  Laterality: Bilateral;   ROBOT ASSISTED LAPAROSCOPIC RADICAL PROSTATECTOMY N/A 12/05/2021   Procedure: XI ROBOTIC ASSISTED LAPAROSCOPIC RADICAL PROSTATECTOMY LEVEL 2;  Surgeon: Raynelle Bring, MD;  Location: [facility]S;  Service: Urology;  Laterality: N/A;   WRIST SURGERY     fell through window    There were no vitals filed for this visit.    Subjective Assessment - 12/12/21 1408     Subjective Pt had prostatectomy 12/05/21 and catheter removed yesterday. I drink about 80-100 oz water per day.  Not a lot of pain just a little when urinating.  Pt has some pain when coughing where the stitches are.  Ptwants to know how to stop leaking    Patient Stated Goals stop having leakage    Currently in Pain? 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    Leisure exercise/work out      Cognition   Overall Cognitive Status Within Functional Limits for tasks assessed      Observation/Other Assessments   Observations sits on sacrum and increased lumbar flexion      Posture/Postural Control   Posture/Postural Control Postural limitations    Postural Limitations Rounded Shoulders      ROM / Strength   AROM / PROM / Strength PROM;Strength;AROM      AROM   Overall AROM Comments lumbar flexion limited; Rt sidebend limited      PROM   Overall PROM Comments  hip ER limited bil      Strength   Overall Strength Comments 5/5 bil hip      Flexibility   Soft Tissue Assessment /Muscle Length yes    Hamstrings 75%      Palpation   Palpation comment lumbar and thoracic paraspinals tight      Ambulation/Gait   Gait Pattern Within Functional Limits                         Objective measurements completed on examination: See above findings.     Pelvic Floor Special Questions - 12/13/21 0001     Prior Pelvic/Prostate Exam Yes    Urinary Leakage Yes    How often all day now    Pad use 3/day    Urinary urgency No    Urinary frequency maybe not sure    Exam Type Deferred              [facility] Adult PT Treatment/Exercise - 12/13/21 0001       Self-Care   Self-Care Other Self-Care Comments    Other Self-Care Comments  reviewed sitting  posture; initial HEP educated and performed                     PT Education - 12/13/21 0900     Education Details Access Code: [email or web address]    Person(s) Educated Patient    Methods Explanation;Demonstration;Tactile cues;Verbal cues;Handout    Comprehension Verbalized understanding;Returned demonstration              PT Short Term Goals - 12/13/21 0909       PT SHORT TERM GOAL #1   Title Pt will have at least 50% less leakage    Time 6    Period Weeks    Status New    Target Date 01/23/22               PT Long Term Goals - 12/13/21 0900       PT LONG TERM GOAL #1   Title pt will be ind with advanced HEP    Time 16    Period Weeks    Status New    Target Date 04/03/22      PT LONG TERM GOAL #2   Title Pt will be able to do normal daily activities without leakage    Time 16    Period Weeks    Status New    Target Date 04/03/22      PT LONG TERM GOAL #3   Title Pt will report no abdominal pain during functional activities such as coughing    Time 12    Period Weeks    Status New    Target Date 03/07/22                    Plan - 12/12/21 1640     Clinical Impression Statement Pt presents to skilled PT after prostatectomy surgery 12/05/2021.  Pt is experiencing leakage and some abdominal pain currently.  He was assessed and has tight hamstrings bil, reduced PROM of hip ER bil, lumbar flexion 50% and limited Rt sidebend 80%.  PT palpated pelvic floor externally to asses contract and lift and bulging.  Pt able to do these techniques with cues.  He needed educated in diaphragm breathing and bulging and was educated in intial HEP stretch and gentle strengthening program. Pt will benefit from skilled PT to address impairments so  he will safely be able to build strength with reduced risk of injury with skilled monitoring as is activity level increases back to normal.    Examination-Activity Limitations Continence    Examination-Participation  Restrictions Community Activity;Interpersonal Relationship    Stability/Clinical Decision Making Evolving/Moderate complexity    Clinical Decision Making Low    Rehab Potential Excellent    PT Frequency 1x / week    PT Duration --   16 weeks   PT Treatment/Interventions ADLs/Self Care Home Management;Biofeedback;Cryotherapy;Electrical Stimulation;Moist Heat;Therapeutic activities;Therapeutic exercise;Neuromuscular re-education;Patient/family education;Manual techniques;Taping;Dry needling    PT Next Visit Plan f/u on initial HEP and progress functional strength    PT Home Exercise Plan Access Code: [email or web address]    Consulted and Agree with Plan of Care Patient             Patient will benefit from skilled therapeutic intervention in order to improve the following deficits and impairments:  Pain, Impaired flexibility, Increased fascial restricitons, Decreased strength, Decreased coordination, Decreased endurance, Increased muscle spasms  Visit Diagnosis: Muscle weakness (generalized)  Other muscle spasm     Problem List Patient Active Problem List   Diagnosis Date Noted   Prostate cancer ([place]) 12/05/2021   Malignant neoplasm of prostate ([place]) 10/08/2021   Elevated PSA 07/05/2021   Personal history of colonic polyps - adenoma 03/22/2012   HYPERTENSION 08/16/2007    Jule Ser, PT 12/13/2021, 9:19 AM  [facility] @ [place], [place], [address] Phone: [phone]   Fax:  [phone]  Name: Tanner Hughes MRN: [id number] Date of Birth: 1961/04/21

## 2021-12-13 NOTE — Patient Instructions (Signed)
Access Code: [email or web address] URL: [email or web address] Date: 12/13/2021 Prepared by: Jari Favre  Exercises Supine Hamstring Stretch with Strap - 1 x daily - 7 x weekly - 1 sets - 3 reps - 30 sec hold Supine Figure 4 Piriformis Stretch - 1 x daily - 7 x weekly - 1 sets - 3 reps - 30 sec hold Hooklying Single Knee to Chest Stretch - 1 x daily - 7 x weekly - 1 sets - 5 reps - 20 sec hold Supine Butterfly Groin Stretch - 1 x daily - 7 x weekly - 1 sets - 3 reps - 30 sec hold Supine Breathing with Hands on Ribcage - 3 x daily - 7 x weekly - 1 sets - 10 reps Sidelying Mid Thoracic Rotation - 1 x daily - 7 x weekly - 3 sets - 10 reps Supine Pelvic Floor Contraction - 5 x daily - 7 x weekly - 1 sets - 10 reps Supine Bridge with Pelvic Floor Contraction - 1 x daily - 7 x weekly - 1 sets - 10 reps

## 2022-01-13 ENCOUNTER — Encounter: Payer: Self-pay | Admitting: Physical Therapy

## 2022-01-13 ENCOUNTER — Other Ambulatory Visit: Payer: Self-pay

## 2022-01-13 ENCOUNTER — Ambulatory Visit: Payer: 59 | Attending: Family Medicine | Admitting: Physical Therapy

## 2022-01-13 DIAGNOSIS — M62838 Other muscle spasm: Secondary | ICD-10-CM | POA: Insufficient documentation

## 2022-01-13 DIAGNOSIS — M6281 Muscle weakness (generalized): Secondary | ICD-10-CM | POA: Diagnosis present

## 2022-01-13 NOTE — Therapy (Signed)
[facility] @ [address] [place], [place], [address] Phone: [phone]   Fax:  [phone]  Physical Therapy Treatment  Patient Details  Name: Tanner Hughes MRN: [id number] Date of Birth: September 04, 1961 Referring Provider (PT): Raynelle Bring, MD   Encounter Date: 01/13/2022   PT End of Session - 01/13/22 1453     Visit Number 2    Date for PT Re-Evaluation 04/03/22    Authorization Type [employer]    PT Start Time 6010    PT Stop Time 1443    PT Time Calculation (min) 40 min    Activity Tolerance Patient tolerated treatment well    Behavior During Therapy [facility] for tasks assessed/performed             Past Medical History:  Diagnosis Date   Hypertension    Personal history of colonic polyps - adenoma 03/22/2012   Diminutive adenoma removed from cecum 02/2012 Carlean Purl) - anticipate routine repeat colonoscopy  approx 02/2017     Past Surgical History:  Procedure Laterality Date   COLONOSCOPY  05/19/2017   per Dr. Carlean Purl, adenomas, repeat in 5 yrs    LYMPHADENECTOMY Bilateral 12/05/2021   Procedure: LYMPHADENECTOMY, PELVIC;  Surgeon: Raynelle Bring, MD;  [facility];  Service: Urology;  Laterality: Bilateral;   ROBOT ASSISTED LAPAROSCOPIC RADICAL PROSTATECTOMY N/A 12/05/2021   Procedure: XI ROBOTIC ASSISTED LAPAROSCOPIC RADICAL PROSTATECTOMY LEVEL 2;  Surgeon: Raynelle Bring, MD;  [facility];  Service: Urology;  Laterality: N/A;   WRIST SURGERY     fell through window    There were no vitals filed for this visit.   Subjective Assessment - 01/13/22 1407     Subjective I think things are better only leakage every couple of days.    Patient Stated Goals stop having leakage    Currently in Pain? No/denies                               [facility] Adult PT Treatment/Exercise - 01/13/22 0001       Neuro Re-ed    Neuro Re-ed Details  functional movement, breathing correctly, isolating the pelvic  floor      Exercises   Exercises Lumbar      Lumbar Exercises: Standing   Functional Squats 15 reps    Functional Squats Limitations mini squat with pelvic lift    Lifting 15 reps    Lifting Limitations hip hinge - dead lift no weight - cues to coordinate and maintain posture; feet staggared lifting 3lb    Shoulder Extension Strengthening;Both;20 reps;Theraband    Theraband Level (Shoulder Extension) Level 2 (Red)    Shoulder Extension Limitations walking fwd/back and feet staggared pallof presses    Other Standing Lumbar Exercises standing leaning with kegel isolated and exhale on exertion                       PT Short Term Goals - 01/13/22 1519       PT SHORT TERM GOAL #1   Title Pt will have at least 50% less leakage    Baseline down to much less diapers needed    Status On-going               PT Long Term Goals - 12/13/21 0900       PT LONG TERM GOAL #1   Title pt will be ind with advanced HEP  Time 16    Period Weeks    Status New    Target Date 04/03/22      PT LONG TERM GOAL #2   Title Pt will be able to do normal daily activities without leakage    Time 16    Period Weeks    Status New    Target Date 04/03/22      PT LONG TERM GOAL #3   Title Pt will report no abdominal pain during functional activities such as coughing    Time 12    Period Weeks    Status New    Target Date 03/07/22                   Plan - 01/13/22 1514     Clinical Impression Statement Pt is making excellent progress and reduced leakage to 1-2 every other day.  Pt is independent with initial HEP. He was able to progress to standing and more functional movements today.  Pt needed moderate amount of cues to ensure he was not holding his breath and using a valsalva when lifting.  Pt was able to do this correctly with extra time and needs to think about the correct muscle coordination.  Pt is recommended to return to 1x/ week to continue with gaining momentum  for his return to all functional activities.    PT Treatment/Interventions ADLs/Self Care Home Management;Biofeedback;Cryotherapy;Electrical Stimulation;Moist Heat;Therapeutic activities;Therapeutic exercise;Neuromuscular re-education;Patient/family education;Manual techniques;Taping;Dry needling    PT Next Visit Plan f/u on hip hinge, progress core, exhale with exertion, f/u on knack    PT Home Exercise Plan Access Code: [email or web address]    Consulted and Agree with Plan of Care Patient             Patient will benefit from skilled therapeutic intervention in order to improve the following deficits and impairments:  Pain, Impaired flexibility, Increased fascial restricitons, Decreased strength, Decreased coordination, Decreased endurance, Increased muscle spasms  Visit Diagnosis: Muscle weakness (generalized)  Other muscle spasm     Problem List Patient Active Problem List   Diagnosis Date Noted   Prostate cancer ([place]) 12/05/2021   Malignant neoplasm of prostate ([place]) 10/08/2021   Elevated PSA 07/05/2021   Personal history of colonic polyps - adenoma 03/22/2012   HYPERTENSION 08/16/2007    Jule Ser, PT 01/13/2022, 3:33 PM  [facility] @ [address] [place], [place], [address] Phone: [phone]   Fax:  [phone]  Name: DEMETRIAS GOODBAR MRN: [id number] Date of Birth: November 05, 1961

## 2022-01-28 ENCOUNTER — Other Ambulatory Visit: Payer: Self-pay

## 2022-01-28 ENCOUNTER — Encounter: Payer: Self-pay | Admitting: Physical Therapy

## 2022-01-28 ENCOUNTER — Ambulatory Visit: Payer: 59 | Admitting: Physical Therapy

## 2022-01-28 DIAGNOSIS — M6281 Muscle weakness (generalized): Secondary | ICD-10-CM | POA: Diagnosis not present

## 2022-01-28 DIAGNOSIS — M62838 Other muscle spasm: Secondary | ICD-10-CM

## 2022-01-28 NOTE — Therapy (Signed)
[facility] @ [address] [place], [place], [address] Phone: [phone]   Fax:  [phone]  Physical Therapy Treatment  Patient Details  Name: Tanner Hughes MRN: [id number] Date of Birth: 28-Oct-1961 Referring Provider (PT): Raynelle Bring, MD   Encounter Date: 01/28/2022   PT End of Session - 01/28/22 1534     Visit Number 3    Date for PT Re-Evaluation 04/03/22    Authorization Type [employer]    PT Start Time 1533    PT Stop Time 1611    PT Time Calculation (min) 38 min    Activity Tolerance Patient tolerated treatment well    Behavior During Therapy [facility] for tasks assessed/performed             Past Medical History:  Diagnosis Date   Hypertension    Personal history of colonic polyps - adenoma 03/22/2012   Diminutive adenoma removed from cecum 02/2012 Carlean Purl) - anticipate routine repeat colonoscopy  approx 02/2017     Past Surgical History:  Procedure Laterality Date   COLONOSCOPY  05/19/2017   per Dr. Carlean Purl, adenomas, repeat in 5 yrs    LYMPHADENECTOMY Bilateral 12/05/2021   Procedure: LYMPHADENECTOMY, PELVIC;  Surgeon: Raynelle Bring, MD;  [facility];  Service: Urology;  Laterality: Bilateral;   ROBOT ASSISTED LAPAROSCOPIC RADICAL PROSTATECTOMY N/A 12/05/2021   Procedure: XI ROBOTIC ASSISTED LAPAROSCOPIC RADICAL PROSTATECTOMY LEVEL 2;  Surgeon: Raynelle Bring, MD;  [facility];  Service: Urology;  Laterality: N/A;   WRIST SURGERY     fell through window    There were no vitals filed for this visit.   Subjective Assessment - 01/28/22 1535     Subjective I have leakage in the evening usually watching TV at night.  I had a little at the gym when doing dead lifts.    Patient Stated Goals stop having leakage    Currently in Pain? No/denies                               [facility] Adult PT Treatment/Exercise - 01/28/22 0001       Self-Care   Self-Care Posture    Posture sitting  and at desk      Neuro Re-ed    Neuro Re-ed Details  functional movements technique with kegels      Lumbar Exercises: Standing   Functional Squats 10 reps    Functional Squats Limitations educated on correct technique    Other Standing Lumbar Exercises standing leaning with kegel isolated and exhale on exertion, tighten and hold 20 sec x 3 reps    Other Standing Lumbar Exercises hip hinge with golf club and then without - 10 x each way                       PT Short Term Goals - 01/28/22 1544       PT SHORT TERM GOAL #1   Title Pt will have at least 50% less leakage    Baseline 70% better    Status Achieved               PT Long Term Goals - 01/28/22 1544       PT LONG TERM GOAL #1   Title pt will be ind with advanced HEP    Status On-going      PT LONG TERM GOAL #2   Title Pt  will be able to do normal daily activities without leakage    Baseline coughing, later, and certain    Status On-going      PT LONG TERM GOAL #3   Title Pt will report no abdominal pain during functional activities such as coughing                   Plan - 01/28/22 1558     Clinical Impression Statement Pt is improving and has met his short term goal of reduced leakage and is using one diaper. States most of the time there is no leakage during the day. Pt was having some leakge at the gym and when demonstrating hip hingeing, looks like he is rounding into lumbar and thoracic flexion.  He did well after the cues with gold club.  Pt able to self correct but found himself slumping a lot during today's session. He was educated on posutre and ways to correct this throughou the day.  Pt will benefit from skilled PT to continue to work on core and pelvic floor strength and coordination.    PT Treatment/Interventions ADLs/Self Care Home Management;Biofeedback;Cryotherapy;Electrical Stimulation;Moist Heat;Therapeutic activities;Therapeutic exercise;Neuromuscular  re-education;Patient/family education;Manual techniques;Taping;Dry needling    PT Next Visit Plan f/u on hip hinge, progress core, exhale with exertion, f/u on knack    PT Home Exercise Plan Access Code: [email or web address]    Consulted and Agree with Plan of Care Patient             Patient will benefit from skilled therapeutic intervention in order to improve the following deficits and impairments:  Pain, Impaired flexibility, Increased fascial restricitons, Decreased strength, Decreased coordination, Decreased endurance, Increased muscle spasms  Visit Diagnosis: Muscle weakness (generalized)  Other muscle spasm     Problem List Patient Active Problem List   Diagnosis Date Noted   Prostate cancer ([place]) 12/05/2021   Malignant neoplasm of prostate ([place]) 10/08/2021   Elevated PSA 07/05/2021   Personal history of colonic polyps - adenoma 03/22/2012   HYPERTENSION 08/16/2007    Jule Ser, PT 01/28/2022, 4:39 PM  [facility] @ [address] [place], [place], [address] Phone: [phone]   Fax:  [phone]  Name: Tanner Hughes MRN: [id number] Date of Birth: 1961-04-08

## 2022-01-28 NOTE — Patient Instructions (Signed)

## 2022-02-05 ENCOUNTER — Encounter: Payer: Self-pay | Admitting: Physical Therapy

## 2022-02-05 ENCOUNTER — Other Ambulatory Visit: Payer: Self-pay

## 2022-02-05 ENCOUNTER — Ambulatory Visit: Payer: 59 | Attending: Family Medicine | Admitting: Physical Therapy

## 2022-02-05 DIAGNOSIS — M6281 Muscle weakness (generalized): Secondary | ICD-10-CM | POA: Insufficient documentation

## 2022-02-05 DIAGNOSIS — M62838 Other muscle spasm: Secondary | ICD-10-CM | POA: Insufficient documentation

## 2022-02-05 NOTE — Patient Instructions (Signed)
Access Code: [email or web address] URL: [email or web address] Date: 02/05/2022 Prepared by: Jari Favre  Exercises Supine Hamstring Stretch with Strap - 1 x daily - 7 x weekly - 1 sets - 3 reps - 30 sec hold Supine Figure 4 Piriformis Stretch - 1 x daily - 7 x weekly - 1 sets - 3 reps - 30 sec hold Hooklying Single Knee to Chest Stretch - 1 x daily - 7 x weekly - 1 sets - 5 reps - 20 sec hold Supine Butterfly Groin Stretch - 1 x daily - 7 x weekly - 1 sets - 3 reps - 30 sec hold Supine Breathing with Hands on Ribcage - 3 x daily - 7 x weekly - 1 sets - 10 reps Sidelying Mid Thoracic Rotation - 1 x daily - 7 x weekly - 3 sets - 10 reps Supine Bridge with Pelvic Floor Contraction - 1 x daily - 7 x weekly - 1 sets - 10 reps Mini Squat with Pelvic Floor Contraction - 1 x daily - 7 x weekly - 1 sets - 10 reps Standing Low Back Flexion at Table - 3 x daily - 7 x weekly - 1 sets - 10 reps Half Deadlift with Kettlebell - 1 x daily - 7 x weekly - 1 sets - 10 reps Table Lean - 3 x daily - 7 x weekly - 1 sets - 10 reps Supine Hip Internal and External Rotation - 1 x daily - 7 x weekly - 10 reps - 1 sets - 5 sec hold Quadruped Pelvic Floor Contraction with Opposite Arm and Leg Lift - 1 x daily - 7 x weekly - 3 sets - 10 reps Primal Push Up - 1 x daily - 7 x weekly - 1 sets - 2 reps - 1 min hold Sidestepping with Pelvic Floor Contraction and Resistance - 1 x daily - 7 x weekly - 3 sets - 10 reps Marching Bridge - 1 x daily - 7 x weekly - 1 sets - 10 reps - 3 hold  Patient Education Office Posture

## 2022-02-05 NOTE — Therapy (Signed)
[facility] @ [address] [place], [place], [address] Phone: [phone]   Fax:  [phone]  Physical Therapy Treatment  Patient Details  Name: Tanner Hughes MRN: [id number] Date of Birth: May 24, 1961 Referring Provider (PT): Raynelle Bring, MD   Encounter Date: 02/05/2022   PT End of Session - 02/05/22 1018     Visit Number 4    Date for PT Re-Evaluation 04/03/22    Authorization Type [employer]    PT Start Time 1015    PT Stop Time 1055    PT Time Calculation (min) 40 min    Activity Tolerance Patient tolerated treatment well    Behavior During Therapy [facility] for tasks assessed/performed             Past Medical History:  Diagnosis Date   Hypertension    Personal history of colonic polyps - adenoma 03/22/2012   Diminutive adenoma removed from cecum 02/2012 Carlean Purl) - anticipate routine repeat colonoscopy  approx 02/2017     Past Surgical History:  Procedure Laterality Date   COLONOSCOPY  05/19/2017   per Dr. Carlean Purl, adenomas, repeat in 5 yrs    LYMPHADENECTOMY Bilateral 12/05/2021   Procedure: LYMPHADENECTOMY, PELVIC;  Surgeon: Raynelle Bring, MD;  [facility];  Service: Urology;  Laterality: Bilateral;   ROBOT ASSISTED LAPAROSCOPIC RADICAL PROSTATECTOMY N/A 12/05/2021   Procedure: XI ROBOTIC ASSISTED LAPAROSCOPIC RADICAL PROSTATECTOMY LEVEL 2;  Surgeon: Raynelle Bring, MD;  [facility];  Service: Urology;  Laterality: N/A;   WRIST SURGERY     fell through window    There were no vitals filed for this visit.   Subjective Assessment - 02/05/22 1226     Subjective I am still leaking a little when I go to get into the recliner but I haven't had any leakage the past 3 days.  It is definitley getting better    Patient Stated Goals stop having leakage    Currently in Pain? No/denies                               [facility] Adult PT Treatment/Exercise - 02/05/22 0001       Lumbar Exercises:  Stretches   Other Lumbar Stretch Exercise hip rotation sitting up and supine - 10x      Lumbar Exercises: Standing   Functional Squats 10 reps    Lifting Limitations hip hinge - dead lift no weight - cues to coordinate and maintain posture    Push / Pull Sled side step and back walking with blue band      Lumbar Exercises: Quadruped   Single Arm Raise 10 reps    Opposite Arm/Leg Raise Right arm/Left leg;Left arm/Right leg;10 reps    Other Quadruped Lumbar Exercises primal press ups                     PT Education - 02/05/22 1057     Education Details Access Code: [email or web address] exercises added #s 12-16 and educated to focus on these for the next [redacted] weeks along with normal gym routine    Person(s) Educated Patient    Methods Explanation;Demonstration;Tactile cues;Verbal cues;Handout    Comprehension Verbalized understanding;Returned demonstration              PT Short Term Goals - 01/28/22 1544       PT SHORT TERM GOAL #1   Title Pt will have at least 50%  less leakage    Baseline 70% better    Status Achieved               PT Long Term Goals - 02/05/22 1032       PT LONG TERM GOAL #1   Title pt will be ind with advanced HEP    Status On-going      PT LONG TERM GOAL #2   Title Pt will be able to do normal daily activities without leakage    Baseline only when leaning back into the chair or stepping forward    Status Partially Met      PT LONG TERM GOAL #3   Title Pt will report no abdominal pain during functional activities such as coughing    Status Achieved                   Plan - 02/05/22 1057     Clinical Impression Statement Pt was doing much better overall.  He demo's good retention of squat and dead lift technique.  Pt was educated in ex's to focus on abdominal strengthening.  Needed cues to keep spine neutral in the quadruped position.  Pt has some rotation to the right from T8-10.  Pt is making excellent progress and expected to  continue to progress with HEP. Will benefit fromskilled PT to ensure successful transition to ind with HEP    PT Treatment/Interventions ADLs/Self Care Home Management;Biofeedback;Cryotherapy;Electrical Stimulation;Moist Heat;Therapeutic activities;Therapeutic exercise;Neuromuscular re-education;Patient/family education;Manual techniques;Taping;Dry needling    PT Next Visit Plan f/u on goals and add to core progression as able - plank and qped with UE and LE ex's row and hip ext?    PT Home Exercise Plan Access Code: [email or web address]    Consulted and Agree with Plan of Care Patient             Patient will benefit from skilled therapeutic intervention in order to improve the following deficits and impairments:  Pain, Impaired flexibility, Increased fascial restricitons, Decreased strength, Decreased coordination, Decreased endurance, Increased muscle spasms  Visit Diagnosis: Muscle weakness (generalized)  Other muscle spasm     Problem List Patient Active Problem List   Diagnosis Date Noted   Prostate cancer ([place]) 12/05/2021   Malignant neoplasm of prostate ([place]) 10/08/2021   Elevated PSA 07/05/2021   Personal history of colonic polyps - adenoma 03/22/2012   HYPERTENSION 08/16/2007    Jule Ser, PT 02/05/2022, 12:27 PM  [facility] @ [place] [place] [address] [place], [place], [address] Phone: [phone]   Fax:  [phone]  Name: Tanner Hughes MRN: [id number] Date of Birth: 15-May-1961

## 2022-02-14 ENCOUNTER — Ambulatory Visit: Payer: 59 | Admitting: Physical Therapy

## 2022-02-14 ENCOUNTER — Encounter: Payer: Self-pay | Admitting: Physical Therapy

## 2022-02-14 ENCOUNTER — Other Ambulatory Visit: Payer: Self-pay

## 2022-02-14 DIAGNOSIS — M6281 Muscle weakness (generalized): Secondary | ICD-10-CM | POA: Diagnosis not present

## 2022-02-14 DIAGNOSIS — M62838 Other muscle spasm: Secondary | ICD-10-CM

## 2022-02-14 NOTE — Therapy (Addendum)
[facility] @ [address] [place], [place], [address] Phone: [phone]   Fax:  [phone]  Physical Therapy Treatment  Patient Details  Name: Tanner Hughes MRN: [id number] Date of Birth: 12-Feb-1961 Referring Provider (PT): Raynelle Bring, MD   Encounter Date: 02/14/2022   PT End of Session - 02/14/22 1051     Visit Number 5    Date for PT Re-Evaluation 04/03/22    Authorization Type [employer]    PT Start Time 6314    PT Stop Time 9702    PT Time Calculation (min) 40 min    Activity Tolerance Patient tolerated treatment well    Behavior During Therapy [facility] for tasks assessed/performed             Past Medical History:  Diagnosis Date   Hypertension    Personal history of colonic polyps - adenoma 03/22/2012   Diminutive adenoma removed from cecum 02/2012 Carlean Purl) - anticipate routine repeat colonoscopy  approx 02/2017     Past Surgical History:  Procedure Laterality Date   COLONOSCOPY  05/19/2017   per Dr. Carlean Purl, adenomas, repeat in 5 yrs    LYMPHADENECTOMY Bilateral 12/05/2021   Procedure: LYMPHADENECTOMY, PELVIC;  Surgeon: Raynelle Bring, MD;  [facility];  Service: Urology;  Laterality: Bilateral;   ROBOT ASSISTED LAPAROSCOPIC RADICAL PROSTATECTOMY N/A 12/05/2021   Procedure: XI ROBOTIC ASSISTED LAPAROSCOPIC RADICAL PROSTATECTOMY LEVEL 2;  Surgeon: Raynelle Bring, MD;  [facility];  Service: Urology;  Laterality: N/A;   WRIST SURGERY     fell through window    There were no vitals filed for this visit.   Subjective Assessment - 02/14/22 1148     Subjective About the same as last week    Patient Stated Goals stop having leakage    Currently in Pain? No/denies                               [facility] Adult PT Treatment/Exercise - 02/14/22 0001       Lumbar Exercises: Stretches   Other Lumbar Stretch Exercise down dog on mat; can get to about 100 deg hip flexion without rounding his  back      Lumbar Exercises: Standing   Other Standing Lumbar Exercises single leg and zipper up with rotation pull on band      Manual Therapy   Manual Therapy Soft tissue mobilization    Soft tissue mobilization hamstrings, gluteals, lumbar paraspinals              Trigger Point Dry Needling - 02/14/22 0001     Consent Given? Yes    Education Handout Provided --   verbally reviewed   Muscles Treated Lower Quadrant Hamstring    Muscles Treated Back/Hip Gluteus minimus;Gluteus medius;Lumbar multifidi    Hamstring Response Twitch response elicited;Palpable increased muscle length    Gluteus Minimus Response Twitch response elicited;Palpable increased muscle length    Gluteus Medius Response Twitch response elicited;Palpable increased muscle length    Lumbar multifidi Response Twitch response elicited;Palpable increased muscle length                   PT Education - 02/14/22 1146     Education Details added single leg with rotation to HEP    Person(s) Educated Patient    Methods Explanation;Demonstration;Tactile cues;Verbal cues;Handout    Comprehension Verbalized understanding;Returned demonstration  PT Short Term Goals - 01/28/22 1544       PT SHORT TERM GOAL #1   Title Pt will have at least 50% less leakage    Baseline 70% better    Status Achieved               PT Long Term Goals - 02/05/22 1032       PT LONG TERM GOAL #1   Title pt will be ind with advanced HEP    Status On-going      PT LONG TERM GOAL #2   Title Pt will be able to do normal daily activities without leakage    Baseline only when leaning back into the chair or stepping forward    Status Partially Met      PT LONG TERM GOAL #3   Title Pt will report no abdominal pain during functional activities such as coughing    Status Achieved                   Plan - 02/14/22 1151     Clinical Impression Statement Today's session focused on improved soft  tissue length throughout hamstrings and gluteals for more freedom of movement in varied pelvic positions without rounding his back.  Pt also still having some difficulty with things involving the single leg and was given exercises to work on more complex and single sided movement with the pelvic floor being engaged.    PT Treatment/Interventions ADLs/Self Care Home Management;Biofeedback;Cryotherapy;Electrical Stimulation;Moist Heat;Therapeutic activities;Therapeutic exercise;Neuromuscular re-education;Patient/family education;Manual techniques;Taping;Dry needling    PT Next Visit Plan f/u on goals, single leg and rotational exercises; f/u on DN#1;  add to core progression as able - plank and qped with UE and LE ex's row and hip ext?    PT Home Exercise Plan Access Code: [email or web address]    Consulted and Agree with Plan of Care Patient             Patient will benefit from skilled therapeutic intervention in order to improve the following deficits and impairments:  Pain, Impaired flexibility, Increased fascial restricitons, Decreased strength, Decreased coordination, Decreased endurance, Increased muscle spasms  Visit Diagnosis: Muscle weakness (generalized)  Other muscle spasm     Problem List Patient Active Problem List   Diagnosis Date Noted   Prostate cancer ([place]) 12/05/2021   Malignant neoplasm of prostate ([place]) 10/08/2021   Elevated PSA 07/05/2021   Personal history of colonic polyps - adenoma 03/22/2012   HYPERTENSION 08/16/2007    Jule Ser, PT 02/14/2022, 11:54 AM  [facility] @ [place], [place], [address] Phone: [phone]   Fax:  [phone]  Name: Tanner Hughes MRN: [id number] Date of Birth: [birth year]   PHYSICAL THERAPY DISCHARGE SUMMARY  Visits from Start of Care: 5  Current functional level related to goals / functional outcomes:   See above goals Remaining deficits: See above    Education / Equipment: HEP  Patient agrees to discharge. Patient goals were partially met. Patient is being discharged due to not returning since the last visit. Gustavus Bryant, PT 02/28/22 11:33 AM

## 2022-02-14 NOTE — Patient Instructions (Signed)
Access Code: [email or web address] URL: [email or web address] Date: 02/14/2022 Prepared by: Jari Favre  Exercises Supine Hamstring Stretch with Strap - 1 x daily - 7 x weekly - 1 sets - 3 reps - 30 sec hold Supine Figure 4 Piriformis Stretch - 1 x daily - 7 x weekly - 1 sets - 3 reps - 30 sec hold Hooklying Single Knee to Chest Stretch - 1 x daily - 7 x weekly - 1 sets - 5 reps - 20 sec hold Supine Butterfly Groin Stretch - 1 x daily - 7 x weekly - 1 sets - 3 reps - 30 sec hold Supine Breathing with Hands on Ribcage - 3 x daily - 7 x weekly - 1 sets - 10 reps Sidelying Mid Thoracic Rotation - 1 x daily - 7 x weekly - 3 sets - 10 reps Supine Bridge with Pelvic Floor Contraction - 1 x daily - 7 x weekly - 1 sets - 10 reps Mini Squat with Pelvic Floor Contraction - 1 x daily - 7 x weekly - 1 sets - 10 reps Standing Low Back Flexion at Table - 3 x daily - 7 x weekly - 1 sets - 10 reps Half Deadlift with Kettlebell - 1 x daily - 7 x weekly - 1 sets - 10 reps Table Lean - 3 x daily - 7 x weekly - 1 sets - 10 reps Supine Hip Internal and External Rotation - 1 x daily - 7 x weekly - 1 sets - 10 reps - 5 sec hold Quadruped Pelvic Floor Contraction with Opposite Arm and Leg Lift - 1 x daily - 7 x weekly - 3 sets - 10 reps Primal Push Up - 1 x daily - 7 x weekly - 1 sets - 2 reps - 1 min hold Sidestepping with Pelvic Floor Contraction and Resistance - 1 x daily - 7 x weekly - 3 sets - 10 reps Marching Bridge - 1 x daily - 7 x weekly - 1 sets - 10 reps - 3 hold Single Leg Stance Stabilization with Quick Side to Side Band Oscillations - Medial Anchor - 1 x daily - 7 x weekly - 3 sets - 10 reps  Patient Education Office Posture

## 2022-02-21 ENCOUNTER — Encounter: Payer: 59 | Admitting: Physical Therapy

## 2022-02-28 ENCOUNTER — Telehealth: Payer: Self-pay | Admitting: Physical Therapy

## 2022-02-28 ENCOUNTER — Encounter: Payer: 59 | Admitting: Physical Therapy

## 2022-02-28 NOTE — Telephone Encounter (Signed)
Patient did not show for appointment.  Patient was called and PT left message to please call us back if any questions.  Pt was d/c from skilled PT and will need a new order if needing to return ? ?Gustavus Bryant, PT ?02/28/22 11:31 AM ? ?

## 2022-04-07 ENCOUNTER — Ambulatory Visit: Payer: 59 | Admitting: Family Medicine

## 2022-04-10 ENCOUNTER — Encounter: Payer: Self-pay | Admitting: Family Medicine

## 2022-04-10 ENCOUNTER — Ambulatory Visit (INDEPENDENT_AMBULATORY_CARE_PROVIDER_SITE_OTHER): Payer: Commercial Managed Care - PPO | Admitting: Family Medicine

## 2022-04-10 VITALS — BP 110/80 | HR 70 | Temp 98.0°F | Wt 219.7 lb

## 2022-04-10 DIAGNOSIS — M109 Gout, unspecified: Secondary | ICD-10-CM

## 2022-04-10 MED ORDER — COLCHICINE 0.6 MG PO TABS: 0.6000 mg | ORAL_TABLET | Freq: Four times a day (QID) | ORAL | 0 refills | Status: AC | PRN

## 2022-04-10 NOTE — Progress Notes (Signed)
? ?  Subjective:  ? ? Patient ID: Tanner Hughes, male    DOB: 1961/09/08, 61 y.o.   MRN: [id number] ? ?HPI ?Here for swelling and pain in the 4th MCP joint of his right hand. This came on suddenly about a week ago. No recent trauma. He has never had this before. On 04-05-22 he went to an urgent care clinic, and he was diagnosed with gout. They took an Xray which showed only mild degenerative changes and soft tissue swelling. His uric acid level was 5.1. They prescribed Prednisone 40 mg to take daily for 5 days. Since then the swelling and pain have improved a bit, but he has the problem.  ? ? ?Review of Systems  ?Constitutional: Negative.   ?Respiratory: Negative.    ?Cardiovascular: Negative.   ?Musculoskeletal:  Positive for arthralgias.  ? ?   ?Objective:  ? Physical Exam ?Constitutional:   ?   Appearance: Normal appearance.  ?Cardiovascular:  ?   Rate and Rhythm: Normal rate and regular rhythm.  ?   Pulses: Normal pulses.  ?   Heart sounds: Normal heart sounds.  ?Pulmonary:  ?   Effort: Pulmonary effort is normal.  ?   Breath sounds: Normal breath sounds.  ?Musculoskeletal:  ?   Comments: The right 4th MCP is swollen, warm, and tender. No erythema. Full ROM.   ?Neurological:  ?   Mental Status: He is alert.  ? ? ? ? ? ?   ?Assessment & Plan:  ?This is consistent with gout. He will try Colchicine 0.6 mg every 6 hours as needed. Recheck prn.  We spent a total of (31   ) minutes reviewing records and discussing these issues.  ? ?Alysia Penna, MD ? ? ?

## 2022-07-07 ENCOUNTER — Encounter: Payer: Self-pay | Admitting: Internal Medicine

## 2022-08-25 ENCOUNTER — Ambulatory Visit (AMBULATORY_SURGERY_CENTER): Payer: Commercial Managed Care - PPO | Admitting: *Deleted

## 2022-08-25 VITALS — Ht 72.0 in | Wt 218.0 lb

## 2022-08-25 DIAGNOSIS — Z86010 Personal history of colonic polyps: Secondary | ICD-10-CM

## 2022-08-25 NOTE — Progress Notes (Signed)
Patient's pre-visit was done today over the phone with the patient. Name,DOB and address verified. Patient denies any allergies to Eggs and Soy. Patient denies any problems with anesthesia/sedation. Patient is not taking any diet pills or blood thinners. No home Oxygen. Insurance confirmed with patient.  Went over prep instructions with patient. Prep instructions sent to pt's MyChart & mailed to pt-pt is aware. Patient understands to call us back with any questions or concerns. Patient is aware of our care-partner policy.

## 2022-09-03 ENCOUNTER — Encounter: Payer: Self-pay | Admitting: Specialist

## 2022-09-03 ENCOUNTER — Encounter: Payer: Self-pay | Admitting: Internal Medicine

## 2022-09-07 ENCOUNTER — Encounter: Payer: Self-pay | Admitting: Certified Registered Nurse Anesthetist

## 2022-09-14 NOTE — Progress Notes (Unsigned)
[facility] History and Physical   Primary Care Physician:  Laurey Morale, MD   Reason for Procedure:   Hx colon adenomas  Plan:    colonoscopy     HPI: TIMOTHEE GALI is a 61 y.o. male s/p removal of diminutive adenoma 2013 and 2 subcm adenomas 2018 here for surveillance exam   Past Medical History:  Diagnosis Date   Cancer ([place]) 11/2021   Gout    Hypertension    Personal history of colonic polyps - adenoma 03/22/2012   Diminutive adenoma removed from cecum 02/2012 Carlean Purl) - anticipate routine repeat colonoscopy  approx 02/2017     Past Surgical History:  Procedure Laterality Date   COLONOSCOPY  05/19/2017   per Dr. Carlean Purl, adenomas, repeat in 5 yrs    LYMPHADENECTOMY Bilateral 12/05/2021   Procedure: LYMPHADENECTOMY, PELVIC;  Surgeon: Raynelle Bring, MD;  Location: [facility];  Service: Urology;  Laterality: Bilateral;   ROBOT ASSISTED LAPAROSCOPIC RADICAL PROSTATECTOMY N/A 12/05/2021   Procedure: XI ROBOTIC ASSISTED LAPAROSCOPIC RADICAL PROSTATECTOMY LEVEL 2;  Surgeon: Raynelle Bring, MD;  Location: [facility];  Service: Urology;  Laterality: N/A;   WRIST SURGERY     fell through window    Prior to Admission medications   Medication Sig Start Date End Date Taking? Authorizing Provider  acetaminophen (TYLENOL) 500 MG tablet Take 500-1,000 mg by mouth every 6 (six) hours as needed (for pain.). Patient not taking: Reported on 08/25/2022    [provider]  colchicine 0.6 MG tablet Take 1 tablet (0.6 mg total) by mouth every 6 (six) hours as needed (gout). Patient not taking: Reported on 08/25/2022 04/10/22   Laurey Morale, MD  Multiple Vitamin (MULTIVITAMIN) tablet Take 1 tablet by mouth daily.    [provider]    Current Outpatient Medications  Medication Sig Dispense Refill   acetaminophen (TYLENOL) 500 MG tablet Take 500-1,000 mg by mouth every 6 (six) hours as needed (for pain.). (Patient not taking: Reported on 08/25/2022)     colchicine 0.6 MG  tablet Take 1 tablet (0.6 mg total) by mouth every 6 (six) hours as needed (gout). (Patient not taking: Reported on 08/25/2022) 60 tablet 0   Multiple Vitamin (MULTIVITAMIN) tablet Take 1 tablet by mouth daily.     No current facility-administered medications for this visit.    Allergies as of 09/15/2022   (No Known Allergies)    Family History  Problem Relation Age of Onset   Diabetes Father    Hyperlipidemia Father    Single kidney Father    Diabetes Maternal Grandmother    Stroke Maternal Grandmother    Depression Other    Diabetes Other    Hyperlipidemia Other    Hypertension Other    Stroke Other    Heart disease Other    Colon cancer Neg Hx    Stomach cancer Neg Hx     Social History   Socioeconomic History   Marital status: Married    Spouse name: Not on file   Number of children: Not on file   Years of education: Not on file   Highest education level: Not on file  Occupational History   Not on file  Tobacco Use   Smoking status: Never   Smokeless tobacco: Never  Vaping Use   Vaping Use: Never used  Substance and Sexual Activity   Alcohol use: Yes    Alcohol/week: 10.0 standard drinks of alcohol    Types: 10 Cans of beer per week  Drug use: No   Sexual activity: Yes  Other Topics Concern   Not on file  Social History Narrative   Occupation: Press photographer for an Therapist, [employer]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  Physical Exam: Vital signs There were no vitals taken for this visit.  General:   Alert,  Well-developed, well-nourished, pleasant and cooperative in NAD Lungs:  Clear  throughout to auscultation.   Heart:  Regular rate and rhythm; no murmurs, clicks, rubs,  or gallops. Abdomen:  Soft, nontender and nondistended. Normal bowel sounds.   Neuro/Psych:  Alert and cooperative. Normal mood and affect. A and O x 3   '@Hermila Millis'$  Simonne Maffucci, MD, [facility] [phone] (pager) 09/14/2022 9:37 PM@

## 2022-09-15 ENCOUNTER — Ambulatory Visit (AMBULATORY_SURGERY_CENTER): Payer: Commercial Managed Care - PPO | Admitting: Internal Medicine

## 2022-09-15 ENCOUNTER — Encounter: Payer: Self-pay | Admitting: Internal Medicine

## 2022-09-15 VITALS — BP 105/68 | HR 52 | Temp 98.0°F | Resp 14 | Ht 72.0 in | Wt 218.0 lb

## 2022-09-15 DIAGNOSIS — D125 Benign neoplasm of sigmoid colon: Secondary | ICD-10-CM | POA: Diagnosis not present

## 2022-09-15 DIAGNOSIS — Z09 Encounter for follow-up examination after completed treatment for conditions other than malignant neoplasm: Secondary | ICD-10-CM | POA: Diagnosis not present

## 2022-09-15 DIAGNOSIS — D128 Benign neoplasm of rectum: Secondary | ICD-10-CM | POA: Diagnosis not present

## 2022-09-15 DIAGNOSIS — Z86010 Personal history of colonic polyps: Secondary | ICD-10-CM | POA: Diagnosis not present

## 2022-09-15 DIAGNOSIS — D127 Benign neoplasm of rectosigmoid junction: Secondary | ICD-10-CM

## 2022-09-15 HISTORY — PX: COLONOSCOPY: SHX174

## 2022-09-15 MED ORDER — SODIUM CHLORIDE 0.9 % IV SOLN: 500.0000 mL | Freq: Once | INTRAVENOUS | Status: DC

## 2022-09-15 NOTE — Progress Notes (Signed)
Called to room to assist during endoscopic procedure.  Patient ID and intended procedure confirmed with present staff. Received instructions for my participation in the procedure from the performing physician.  

## 2022-09-15 NOTE — Progress Notes (Signed)
Pt's states no medical or surgical changes since previsit or office visit. 

## 2022-09-15 NOTE — Op Note (Signed)
[facility] Patient Name: Tanner Hughes Procedure Date: 09/15/2022 2:19 PM MRN: [id number] Endoscopist: Gatha Mayer , MD Age: 61 Referring MD:  Date of Birth: 1961/08/18 Gender: Male Account #: [id number] Procedure:                Colonoscopy Indications:              Surveillance: Personal history of adenomatous                            polyps on last colonoscopy 5 years ago, Last                            colonoscopy: 2018 Medicines:                Monitored Anesthesia Care Procedure:                Pre-Anesthesia Assessment:                           - Prior to the procedure, a History and Physical                            was performed, and patient medications and                            allergies were reviewed. The patient's tolerance of                            previous anesthesia was also reviewed. The risks                            and benefits of the procedure and the sedation                            options and risks were discussed with the patient.                            All questions were answered, and informed consent                            was obtained. Prior Anticoagulants: The patient has                            taken no previous anticoagulant or antiplatelet                            agents. ASA Grade Assessment: II - A patient with                            mild systemic disease. After reviewing the risks                            and benefits, the patient was deemed in  satisfactory condition to undergo the procedure.                           After obtaining informed consent, the colonoscope                            was passed under direct vision. Throughout the                            procedure, the patient's blood pressure, pulse, and                            oxygen saturations were monitored continuously. The                            CF [id number] #[id number] was introduced through the anus                             and advanced to the the ileocecal valve. The                            colonoscopy was somewhat difficult due to                            significant looping. Successful completion of the                            procedure was aided by applying abdominal pressure.                            The patient tolerated the procedure well. The                            quality of the bowel preparation was good. The                            ileocecal valve, appendiceal orifice, and rectum                            were photographed. The bowel preparation used was                            Miralax via split dose instruction. Scope In: 2:24:20 PM Scope Out: 2:43:01 PM Scope Withdrawal Time: 0 hours 13 minutes 53 seconds  Total Procedure Duration: 0 hours 18 minutes 41 seconds  Findings:                 The digital exam findings include surgically absent                            prostate.                           Two sessile polyps were found in the rectum and  proximal sigmoid colon. The polyps were diminutive                            in size. These polyps were removed with a cold                            snare. Resection and retrieval were complete.                            Verification of patient identification for the                            specimen was done. Estimated blood loss was minimal.                           The exam was otherwise without abnormality on                            direct and retroflexion views. Complications:            No immediate complications. Estimated Blood Loss:     Estimated blood loss was minimal. Impression:               - A surgically absent prostate found on digital                            exam.                           - Two diminutive polyps in the rectum and in the                            proximal sigmoid colon, removed with a cold snare.                            Resected and  retrieved.                           - The examination was otherwise normal on direct                            and retroflexion views.                           - Personal history of colonic polyps - 2013                            diminutive adenoma and 2018 2 subcentimeter                            adenomas. Recommendation:           - Patient has a contact number available for                            emergencies. The signs and symptoms of potential  delayed complications were discussed with the                            patient. Return to normal activities tomorrow.                            Written discharge instructions were provided to the                            patient.                           - Resume previous diet.                           - Continue present medications.                           - Repeat colonoscopy is recommended for                            surveillance. The colonoscopy date will be                            determined after pathology results from today's                            exam become available for review. Gatha Mayer, MD 09/15/2022 2:49:05 PM This report has been signed electronically.

## 2022-09-15 NOTE — Progress Notes (Signed)
Report given to PACU, vss 

## 2022-09-15 NOTE — Patient Instructions (Addendum)
I found and removed 2 tiny polyps - again.  I will let you know pathology results and when to have another routine colonoscopy by mail and/or My Chart.  I appreciate the opportunity to care for you. Gatha Mayer, MD, [facility]  Please read handouts provided. Continue present medications. Await pathology results.   YOU HAD AN ENDOSCOPIC PROCEDURE TODAY AT [facility]:   Refer to the procedure report that was given to you for any specific questions about what was found during the examination.  If the procedure report does not answer your questions, please call your gastroenterologist to clarify.  If you requested that your care partner not be given the details of your procedure findings, then the procedure report has been included in a sealed envelope for you to review at your convenience later.  YOU SHOULD EXPECT: Some feelings of bloating in the abdomen. Passage of more gas than usual.  Walking can help get rid of the air that was put into your GI tract during the procedure and reduce the bloating. If you had a lower endoscopy (such as a colonoscopy or flexible sigmoidoscopy) you may notice spotting of blood in your stool or on the toilet paper. If you underwent a bowel prep for your procedure, you may not have a normal bowel movement for a few days.  Please Note:  You might notice some irritation and congestion in your nose or some drainage.  This is from the oxygen used during your procedure.  There is no need for concern and it should clear up in a day or so.  SYMPTOMS TO REPORT IMMEDIATELY:  Following lower endoscopy (colonoscopy or flexible sigmoidoscopy):  Excessive amounts of blood in the stool  Significant tenderness or worsening of abdominal pains  Swelling of the abdomen that is new, acute  Fever of 100F or higher.  For urgent or emergent issues, a gastroenterologist can be reached at any hour by calling [phone]. Do not use MyChart messaging for urgent  concerns.    DIET:  We do recommend a small meal at first, but then you may proceed to your regular diet.  Drink plenty of fluids but you should avoid alcoholic beverages for 24 hours.  ACTIVITY:  You should plan to take it easy for the rest of today and you should NOT DRIVE or use heavy machinery until tomorrow (because of the sedation medicines used during the test).    FOLLOW UP: Our staff will call the number listed on your records the next business day following your procedure.  We will call around 7:15- 8:00 am to check on you and address any questions or concerns that you may have regarding the information given to you following your procedure. If we do not reach you, we will leave a message.     If any biopsies were taken you will be contacted by phone or by letter within the next 1-3 weeks.  Please call us at [phone] if you have not heard about the biopsies in 3 weeks.    SIGNATURES/CONFIDENTIALITY: You and/or your care partner have signed paperwork which will be entered into your electronic medical record.  These signatures attest to the fact that that the information above on your After Visit Summary has been reviewed and is understood.  Full responsibility of the confidentiality of this discharge information lies with you and/or your care-partner.

## 2022-09-16 ENCOUNTER — Telehealth: Payer: Self-pay | Admitting: *Deleted

## 2022-09-16 NOTE — Telephone Encounter (Signed)
No answer on  follow up call. Left message.   

## 2022-09-18 ENCOUNTER — Encounter: Payer: Self-pay | Admitting: Internal Medicine

## 2023-07-23 ENCOUNTER — Ambulatory Visit: Payer: Commercial Managed Care - PPO | Admitting: Family Medicine

## 2023-07-29 ENCOUNTER — Ambulatory Visit (INDEPENDENT_AMBULATORY_CARE_PROVIDER_SITE_OTHER): Payer: Commercial Managed Care - PPO | Admitting: Family Medicine

## 2023-07-29 ENCOUNTER — Encounter: Payer: Self-pay | Admitting: Family Medicine

## 2023-07-29 VITALS — BP 102/80 | HR 62 | Temp 98.1°F | Resp 16 | Ht 73.0 in | Wt 217.5 lb

## 2023-07-29 DIAGNOSIS — E291 Testicular hypofunction: Secondary | ICD-10-CM | POA: Diagnosis not present

## 2023-07-29 DIAGNOSIS — Z0000 Encounter for general adult medical examination without abnormal findings: Secondary | ICD-10-CM | POA: Diagnosis not present

## 2023-07-29 LAB — LIPID PANEL
Cholesterol: 169 mg/dL (ref 0–200)
HDL: 46 mg/dL (ref >39.00)
LDL Cholesterol: 109 mg/dL — ABNORMAL HIGH (ref 0–99)
NonHDL: 123.11
Total CHOL/HDL Ratio: 4
Triglycerides: 73 mg/dL (ref 0.0–149.0)
VLDL: 14.6 mg/dL (ref 0.0–40.0)

## 2023-07-29 LAB — HEPATIC FUNCTION PANEL
ALT: 38 U/L (ref 0–53)
AST: 29 U/L (ref 0–37)
Albumin: 4.6 g/dL (ref 3.5–5.2)
Alkaline Phosphatase: 57 U/L (ref 39–117)
Bilirubin, Direct: 0.1 mg/dL (ref 0.0–0.3)
Total Bilirubin: 0.6 mg/dL (ref 0.2–1.2)
Total Protein: 7.4 g/dL (ref 6.0–8.3)

## 2023-07-29 LAB — CBC WITH DIFFERENTIAL/PLATELET
Basophils Absolute: 0.1 10*3/uL (ref 0.0–0.1)
Basophils Relative: 1.1 % (ref 0.0–3.0)
Eosinophils Absolute: 0.2 10*3/uL (ref 0.0–0.7)
Eosinophils Relative: 3.5 % (ref 0.0–5.0)
HCT: 46.4 % (ref 39.0–52.0)
Hemoglobin: 15.7 g/dL (ref 13.0–17.0)
Lymphocytes Relative: 33.9 % (ref 12.0–46.0)
Lymphs Abs: 2 10*3/uL (ref 0.7–4.0)
MCHC: 33.8 g/dL (ref 30.0–36.0)
MCV: 92.2 fl (ref 78.0–100.0)
Monocytes Absolute: 0.5 10*3/uL (ref 0.1–1.0)
Monocytes Relative: 8.8 % (ref 3.0–12.0)
Neutro Abs: 3.2 10*3/uL (ref 1.4–7.7)
Neutrophils Relative %: 52.7 % (ref 43.0–77.0)
Platelets: 220 10*3/uL (ref 150.0–400.0)
RBC: 5.03 Mil/uL (ref 4.22–5.81)
RDW: 13.1 % (ref 11.5–15.5)
WBC: 6 10*3/uL (ref 4.0–10.5)

## 2023-07-29 LAB — BASIC METABOLIC PANEL
BUN: 19 mg/dL (ref 6–23)
CO2: 27 mEq/L (ref 19–32)
Calcium: 9.7 mg/dL (ref 8.4–10.5)
Chloride: 105 mEq/L (ref 96–112)
Creatinine, Ser: 0.98 mg/dL (ref 0.40–1.50)
GFR: 83.12 mL/min (ref >60.00)
Glucose, Bld: 91 mg/dL (ref 70–99)
Potassium: 4.2 mEq/L (ref 3.5–5.1)
Sodium: 139 mEq/L (ref 135–145)

## 2023-07-29 LAB — TESTOSTERONE: Testosterone: 427.98 ng/dL (ref 300.00–890.00)

## 2023-07-29 LAB — TSH: TSH: 1.95 u[IU]/mL (ref 0.35–5.50)

## 2023-07-29 LAB — HEMOGLOBIN A1C: Hgb A1c MFr Bld: 5.6 % (ref 4.6–6.5)

## 2023-07-29 NOTE — Progress Notes (Signed)
Subjective:    Patient ID: Tanner Hughes, male    DOB: 1961-01-28, 62 y.o.   MRN: [id number]  HPI Here for a well exam. He feels well in general, although he describes some low energy and low libido. He was on testosterone replacement until his prostate cancer was diagnosed 2 years ago. He asks if he could start back on this. He last saw Urology several months ago.    Review of Systems  Constitutional:  Positive for fatigue.  HENT: Negative.    Eyes: Negative.   Respiratory: Negative.    Cardiovascular: Negative.   Gastrointestinal: Negative.   Genitourinary: Negative.   Musculoskeletal: Negative.   Skin: Negative.   Neurological: Negative.   Psychiatric/Behavioral: Negative.         Objective:   Physical Exam Constitutional:      General: He is not in acute distress.    Appearance: Normal appearance. He is well-developed. He is not diaphoretic.  HENT:     Head: Normocephalic and atraumatic.     Right Ear: External ear normal.     Left Ear: External ear normal.     Nose: Nose normal.     Mouth/Throat:     Pharynx: No oropharyngeal exudate.  Eyes:     General: No scleral icterus.       Right eye: No discharge.        Left eye: No discharge.     Conjunctiva/sclera: Conjunctivae normal.     Pupils: Pupils are equal, round, and reactive to light.  Neck:     Thyroid: No thyromegaly.     Vascular: No JVD.     Trachea: No tracheal deviation.  Cardiovascular:     Rate and Rhythm: Normal rate and regular rhythm.     Pulses: Normal pulses.     Heart sounds: Normal heart sounds. No murmur heard.    No friction rub. No gallop.  Pulmonary:     Effort: Pulmonary effort is normal. No respiratory distress.     Breath sounds: Normal breath sounds. No wheezing or rales.  Chest:     Chest wall: No tenderness.  Abdominal:     General: Bowel sounds are normal. There is no distension.     Palpations: Abdomen is soft. There is no mass.     Tenderness: There is no abdominal  tenderness. There is no guarding or rebound.  Genitourinary:    Penis: No tenderness.   Musculoskeletal:        General: No tenderness. Normal range of motion.     Cervical back: Neck supple.  Lymphadenopathy:     Cervical: No cervical adenopathy.  Skin:    General: Skin is warm and dry.     Coloration: Skin is not pale.     Findings: No erythema or rash.  Neurological:     General: No focal deficit present.     Mental Status: He is alert and oriented to person, place, and time.     Cranial Nerves: No cranial nerve deficit.     Motor: No abnormal muscle tone.     Coordination: Coordination normal.     Deep Tendon Reflexes: Reflexes are normal and symmetric. Reflexes normal.  Psychiatric:        Mood and Affect: Mood normal.        Behavior: Behavior normal.        Thought Content: Thought content normal.        Judgment: Judgment normal.  Assessment & Plan:  Well exam. We discussed diet and exercise. Get fasting labs. We will check a testosterone level and go from there.  Gershon Crane, MD

## 2023-08-04 MED ORDER — TESTOSTERONE CYPIONATE 200 MG/ML IM SOLN: 200.0000 mg | INTRAMUSCULAR | 2 refills | Status: DC

## 2023-08-04 MED ORDER — "LUER LOCK SAFETY SYRINGES 22G X 1-1/2"" 3 ML MISC": 1.0000 | 2 refills | Status: AC

## 2023-08-04 NOTE — Addendum Note (Signed)
Addended by: Gershon Crane A on: 08/04/2023 08:47 AM   Modules accepted: Orders

## 2024-03-25 ENCOUNTER — Other Ambulatory Visit

## 2024-03-25 ENCOUNTER — Telehealth: Payer: Self-pay | Admitting: *Deleted

## 2024-03-25 DIAGNOSIS — E291 Testicular hypofunction: Secondary | ICD-10-CM

## 2024-03-25 LAB — TESTOSTERONE: Testosterone: 381.66 ng/dL (ref 300.00–890.00)

## 2024-03-25 NOTE — Telephone Encounter (Signed)
 Patient came in today requesting lab testing for a testosterone level.  After reviewing the chart from the visit on 07/29/2023, PCP advised repeat in 6 months.  Order entered under Cory's name as PCP is out of the office.

## 2024-03-30 ENCOUNTER — Encounter: Payer: Self-pay | Admitting: *Deleted

## 2024-05-10 ENCOUNTER — Other Ambulatory Visit: Payer: Self-pay | Admitting: Family Medicine

## 2024-05-24 ENCOUNTER — Encounter: Payer: Self-pay | Admitting: Family Medicine

## 2024-05-24 ENCOUNTER — Ambulatory Visit (INDEPENDENT_AMBULATORY_CARE_PROVIDER_SITE_OTHER): Admitting: Family Medicine

## 2024-05-24 VITALS — BP 110/78 | HR 70 | Temp 98.2°F | Wt 221.0 lb

## 2024-05-24 DIAGNOSIS — F419 Anxiety disorder, unspecified: Secondary | ICD-10-CM | POA: Diagnosis not present

## 2024-05-24 MED ORDER — ESCITALOPRAM OXALATE 10 MG PO TABS: 10.0000 mg | ORAL_TABLET | Freq: Every day | ORAL | 2 refills | Status: DC

## 2024-05-24 NOTE — Progress Notes (Signed)
   Subjective:    Patient ID: Tanner Hughes, male    DOB: Apr 28, 1961, 63 y.o.   MRN: [id number]  HPI Here asking for help with anxiety. He owns his own business, and he has been dealing with a lot of stress. He often has trouble sleeping, and he gets irritable at times. He denies depression symptoms.    Review of Systems  Constitutional: Negative.   Respiratory: Negative.    Cardiovascular: Negative.   Psychiatric/Behavioral:  Positive for sleep disturbance. Negative for agitation, behavioral problems, confusion, decreased concentration, dysphoric mood and hallucinations. The patient is nervous/anxious.        Objective:   Physical Exam Constitutional:      Appearance: Normal appearance.  Cardiovascular:     Rate and Rhythm: Normal rate and regular rhythm.     Pulses: Normal pulses.     Heart sounds: Normal heart sounds.  Pulmonary:     Effort: Pulmonary effort is normal.     Breath sounds: Normal breath sounds.  Neurological:     Mental Status: He is alert and oriented to person, place, and time.  Psychiatric:        Mood and Affect: Mood normal.        Behavior: Behavior normal.        Thought Content: Thought content normal.           Assessment & Plan:  Anxiety, he will try Lexapro 10 mg daily. Report back in 3-4 weeks. Corita Diego, MD

## 2024-05-25 ENCOUNTER — Telehealth: Payer: Self-pay | Admitting: *Deleted

## 2024-05-25 MED ORDER — SERTRALINE HCL 50 MG PO TABS: 50.0000 mg | ORAL_TABLET | Freq: Every day | ORAL | 3 refills | Status: DC

## 2024-05-25 NOTE — Telephone Encounter (Signed)
 Copied from CRM [id number]. Topic: Clinical - Medication Question >> May 24, 2024  9:56 AM DeAngela L wrote: Reason for CRM: Patient calling to ask if Dr Alyne Babinski could change the medication from Lexapro to Zoloft "there was a conversation in the office visit today and Pt found out the medication from his friend is Zoloft  Patient num [phone] (M)

## 2024-05-25 NOTE — Telephone Encounter (Signed)
 I sent in the Zoloft

## 2024-08-30 ENCOUNTER — Other Ambulatory Visit: Payer: Self-pay | Admitting: Family Medicine
